# Patient Record
Sex: Female | Born: 1960 | Race: Asian | Hispanic: No | Marital: Married
Health system: Midwestern US, Community
[De-identification: ages and names within clinical notes are randomized; demographics above are authoritative.]

## PROBLEM LIST (undated history)

## (undated) DIAGNOSIS — G43909 Migraine, unspecified, not intractable, without status migrainosus: Secondary | ICD-10-CM

## (undated) DIAGNOSIS — R52 Pain, unspecified: Secondary | ICD-10-CM

## (undated) DIAGNOSIS — M79672 Pain in left foot: Secondary | ICD-10-CM

## (undated) DIAGNOSIS — R109 Unspecified abdominal pain: Secondary | ICD-10-CM

---

## 2008-02-15 ENCOUNTER — Emergency Department (HOSPITAL_COMMUNITY): Admission: EM | Admit: 2008-02-15 | Discharge: 2008-02-15 | Payer: Self-pay | Admitting: Emergency Medicine

## 2008-04-04 ENCOUNTER — Emergency Department (HOSPITAL_COMMUNITY): Admission: EM | Admit: 2008-04-04 | Discharge: 2008-04-04 | Payer: Self-pay | Admitting: Family Medicine

## 2008-06-05 ENCOUNTER — Emergency Department (HOSPITAL_COMMUNITY): Admission: EM | Admit: 2008-06-05 | Discharge: 2008-06-05 | Payer: Self-pay | Admitting: Family Medicine

## 2008-07-25 ENCOUNTER — Emergency Department (HOSPITAL_COMMUNITY): Admission: EM | Admit: 2008-07-25 | Discharge: 2008-07-25 | Payer: Self-pay | Admitting: Family Medicine

## 2008-08-18 ENCOUNTER — Encounter: Admission: RE | Admit: 2008-08-18 | Discharge: 2008-08-18 | Payer: Self-pay | Admitting: Pulmonary Disease

## 2008-08-22 ENCOUNTER — Emergency Department (HOSPITAL_COMMUNITY): Admission: EM | Admit: 2008-08-22 | Discharge: 2008-08-22 | Payer: Self-pay | Admitting: Family Medicine

## 2008-09-01 ENCOUNTER — Emergency Department (HOSPITAL_COMMUNITY): Admission: EM | Admit: 2008-09-01 | Discharge: 2008-09-01 | Payer: Self-pay | Admitting: Emergency Medicine

## 2008-09-17 ENCOUNTER — Emergency Department (HOSPITAL_COMMUNITY): Admission: EM | Admit: 2008-09-17 | Discharge: 2008-09-17 | Payer: Self-pay | Admitting: Family Medicine

## 2008-09-22 ENCOUNTER — Emergency Department (HOSPITAL_COMMUNITY): Admission: EM | Admit: 2008-09-22 | Discharge: 2008-09-22 | Payer: Self-pay | Admitting: Family Medicine

## 2008-10-27 ENCOUNTER — Encounter: Admission: RE | Admit: 2008-10-27 | Discharge: 2008-10-27 | Payer: Self-pay | Admitting: Surgery

## 2008-11-21 ENCOUNTER — Encounter: Payer: Self-pay | Admitting: Physician Assistant

## 2008-11-30 DIAGNOSIS — R229 Localized swelling, mass and lump, unspecified: Secondary | ICD-10-CM

## 2008-12-11 ENCOUNTER — Ambulatory Visit: Payer: Self-pay | Admitting: Physician Assistant

## 2008-12-11 DIAGNOSIS — G43909 Migraine, unspecified, not intractable, without status migrainosus: Secondary | ICD-10-CM | POA: Insufficient documentation

## 2008-12-11 DIAGNOSIS — N949 Unspecified condition associated with female genital organs and menstrual cycle: Secondary | ICD-10-CM

## 2008-12-11 LAB — CONVERTED CEMR LAB: Beta hcg, urine, semiquantitative: NEGATIVE

## 2008-12-12 LAB — CONVERTED CEMR LAB
ALT: 9 units/L (ref 0–35)
AST: 15 units/L (ref 0–37)
Albumin: 4.8 g/dL (ref 3.5–5.2)
Alkaline Phosphatase: 60 units/L (ref 39–117)
BUN: 6 mg/dL (ref 6–23)
Basophils Absolute: 0 10*3/uL (ref 0.0–0.1)
Basophils Relative: 0 % (ref 0–1)
CO2: 20 meq/L (ref 19–32)
Calcium: 9.4 mg/dL (ref 8.4–10.5)
Chloride: 105 meq/L (ref 96–112)
Creatinine, Ser: 0.57 mg/dL (ref 0.40–1.20)
Eosinophils Absolute: 0.1 10*3/uL (ref 0.0–0.7)
Eosinophils Relative: 1 % (ref 0–5)
FSH: 6.6 milliintl units/mL
Glucose, Bld: 96 mg/dL (ref 70–99)
HCT: 40.7 % (ref 36.0–46.0)
Hemoglobin: 13.5 g/dL (ref 12.0–15.0)
Lymphocytes Relative: 28 % (ref 12–46)
Lymphs Abs: 2.6 10*3/uL (ref 0.7–4.0)
MCHC: 33.2 g/dL (ref 30.0–36.0)
MCV: 90.8 fL (ref 78.0–100.0)
Monocytes Absolute: 0.7 10*3/uL (ref 0.1–1.0)
Monocytes Relative: 7 % (ref 3–12)
Neutro Abs: 6.2 10*3/uL (ref 1.7–7.7)
Neutrophils Relative %: 64 % (ref 43–77)
Platelets: 303 10*3/uL (ref 150–400)
Potassium: 4.5 meq/L (ref 3.5–5.3)
RBC: 4.48 M/uL (ref 3.87–5.11)
RDW: 13.9 % (ref 11.5–15.5)
Sodium: 141 meq/L (ref 135–145)
TSH: 2.419 microintl units/mL (ref 0.350–4.500)
Total Bilirubin: 2.7 mg/dL — ABNORMAL HIGH (ref 0.3–1.2)
Total Protein: 7.4 g/dL (ref 6.0–8.3)
WBC: 9.6 10*3/uL (ref 4.0–10.5)

## 2008-12-18 ENCOUNTER — Telehealth: Payer: Self-pay | Admitting: Physician Assistant

## 2009-01-11 ENCOUNTER — Other Ambulatory Visit: Admission: RE | Admit: 2009-01-11 | Discharge: 2009-01-11 | Payer: Self-pay | Admitting: Family Medicine

## 2009-01-11 ENCOUNTER — Ambulatory Visit: Payer: Self-pay | Admitting: Physician Assistant

## 2009-01-12 LAB — CONVERTED CEMR LAB
Chlamydia, DNA Probe: NEGATIVE
GC Probe Amp, Genital: NEGATIVE

## 2009-01-15 ENCOUNTER — Encounter: Payer: Self-pay | Admitting: Physician Assistant

## 2009-01-15 LAB — CONVERTED CEMR LAB

## 2009-01-26 ENCOUNTER — Encounter: Payer: Self-pay | Admitting: Physician Assistant

## 2009-01-26 ENCOUNTER — Ambulatory Visit (HOSPITAL_COMMUNITY): Admission: RE | Admit: 2009-01-26 | Discharge: 2009-01-26 | Payer: Self-pay | Admitting: Internal Medicine

## 2009-01-29 ENCOUNTER — Ambulatory Visit (HOSPITAL_COMMUNITY): Admission: RE | Admit: 2009-01-29 | Discharge: 2009-01-29 | Payer: Self-pay | Admitting: Internal Medicine

## 2009-01-30 ENCOUNTER — Encounter (INDEPENDENT_AMBULATORY_CARE_PROVIDER_SITE_OTHER): Payer: Self-pay | Admitting: *Deleted

## 2009-03-11 ENCOUNTER — Telehealth: Payer: Self-pay | Admitting: Physician Assistant

## 2009-03-13 ENCOUNTER — Ambulatory Visit: Payer: Self-pay | Admitting: Physician Assistant

## 2009-06-12 ENCOUNTER — Emergency Department (HOSPITAL_COMMUNITY): Admission: EM | Admit: 2009-06-12 | Discharge: 2009-06-12 | Payer: Self-pay | Admitting: Family Medicine

## 2009-09-15 IMAGING — CR DG CHEST 2V
2 series · 2 of 2 positions shown · non-contrast
Comparison: None

CLINICAL DATA: Positive PPD test.

CHEST - 2 VIEW

[w chest pa]
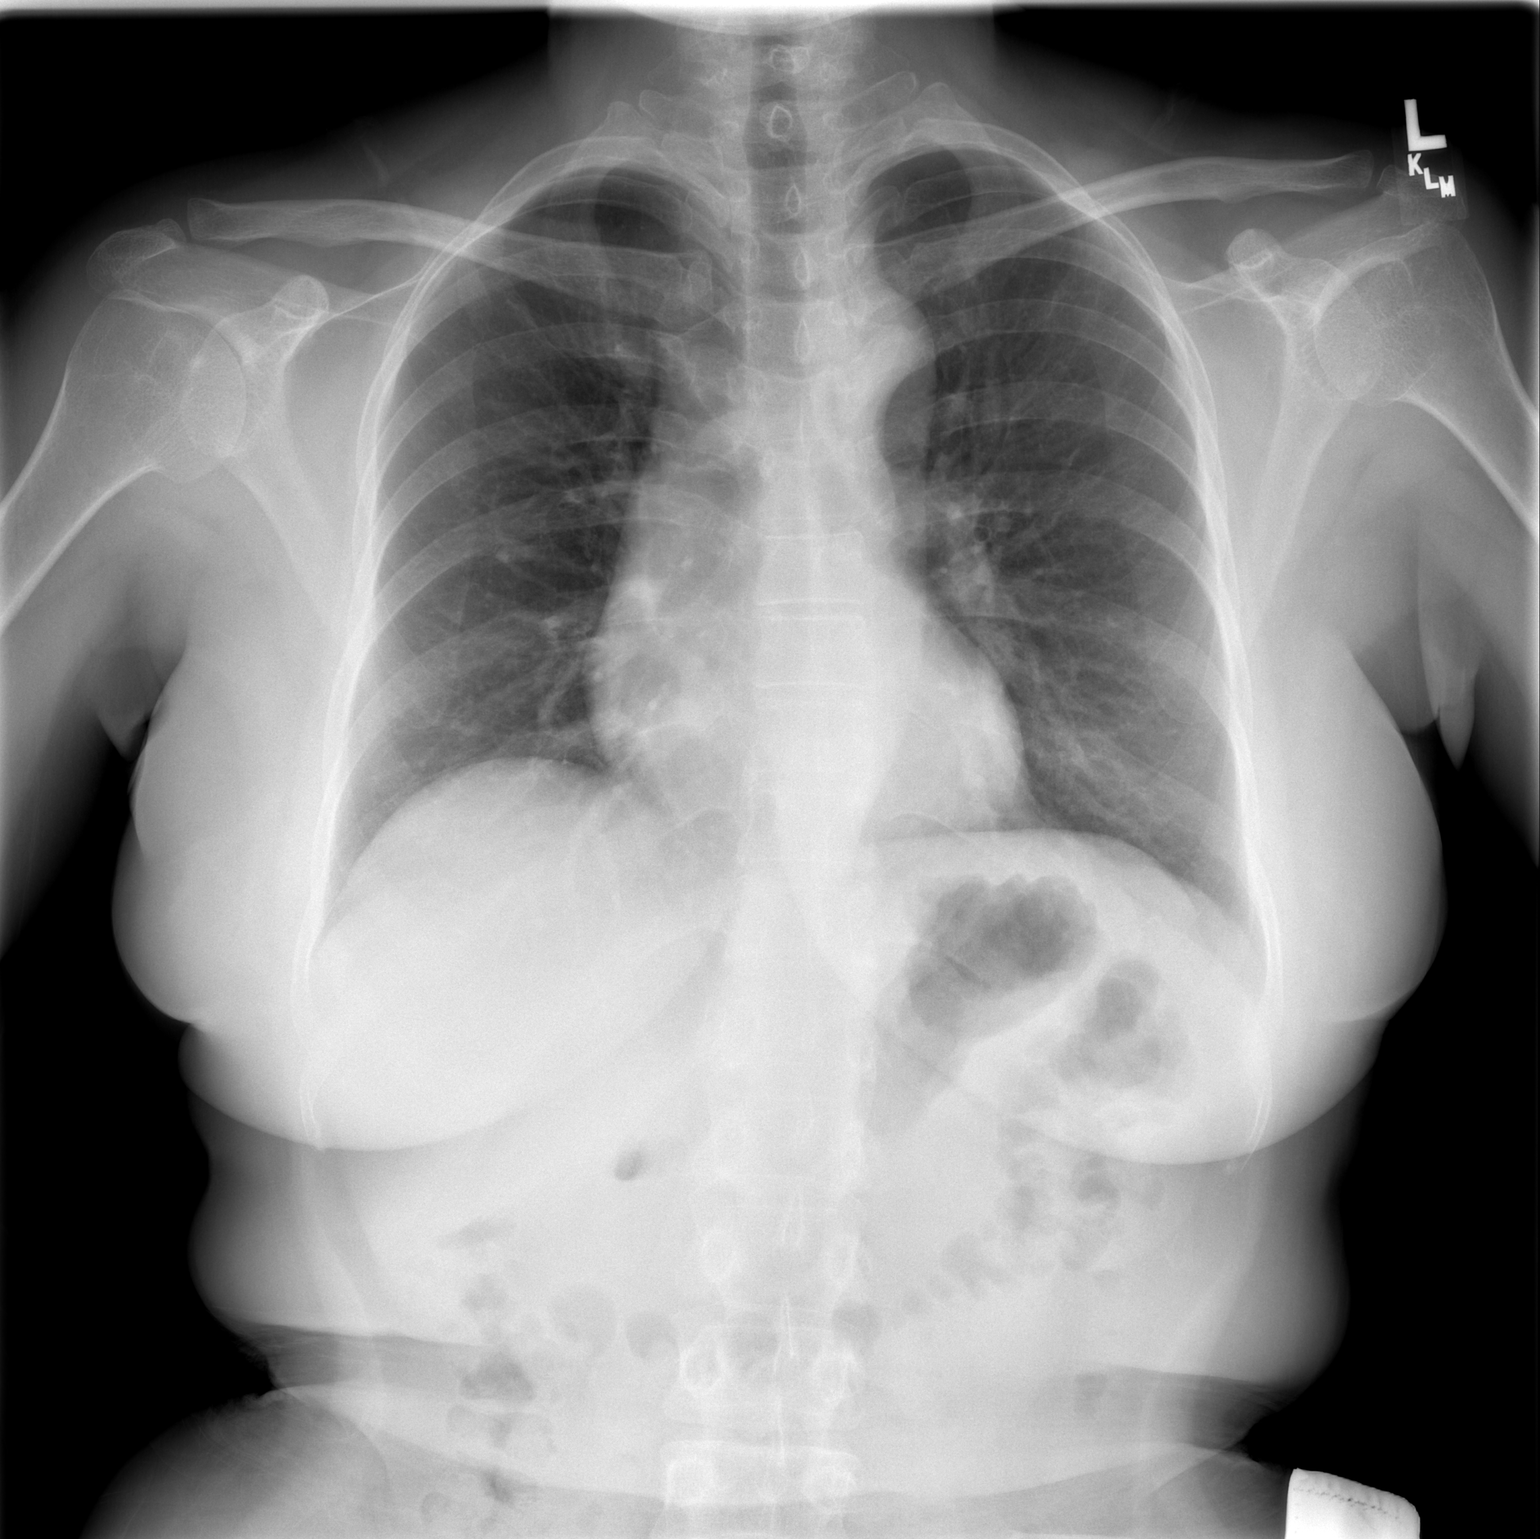

[w chest lat]
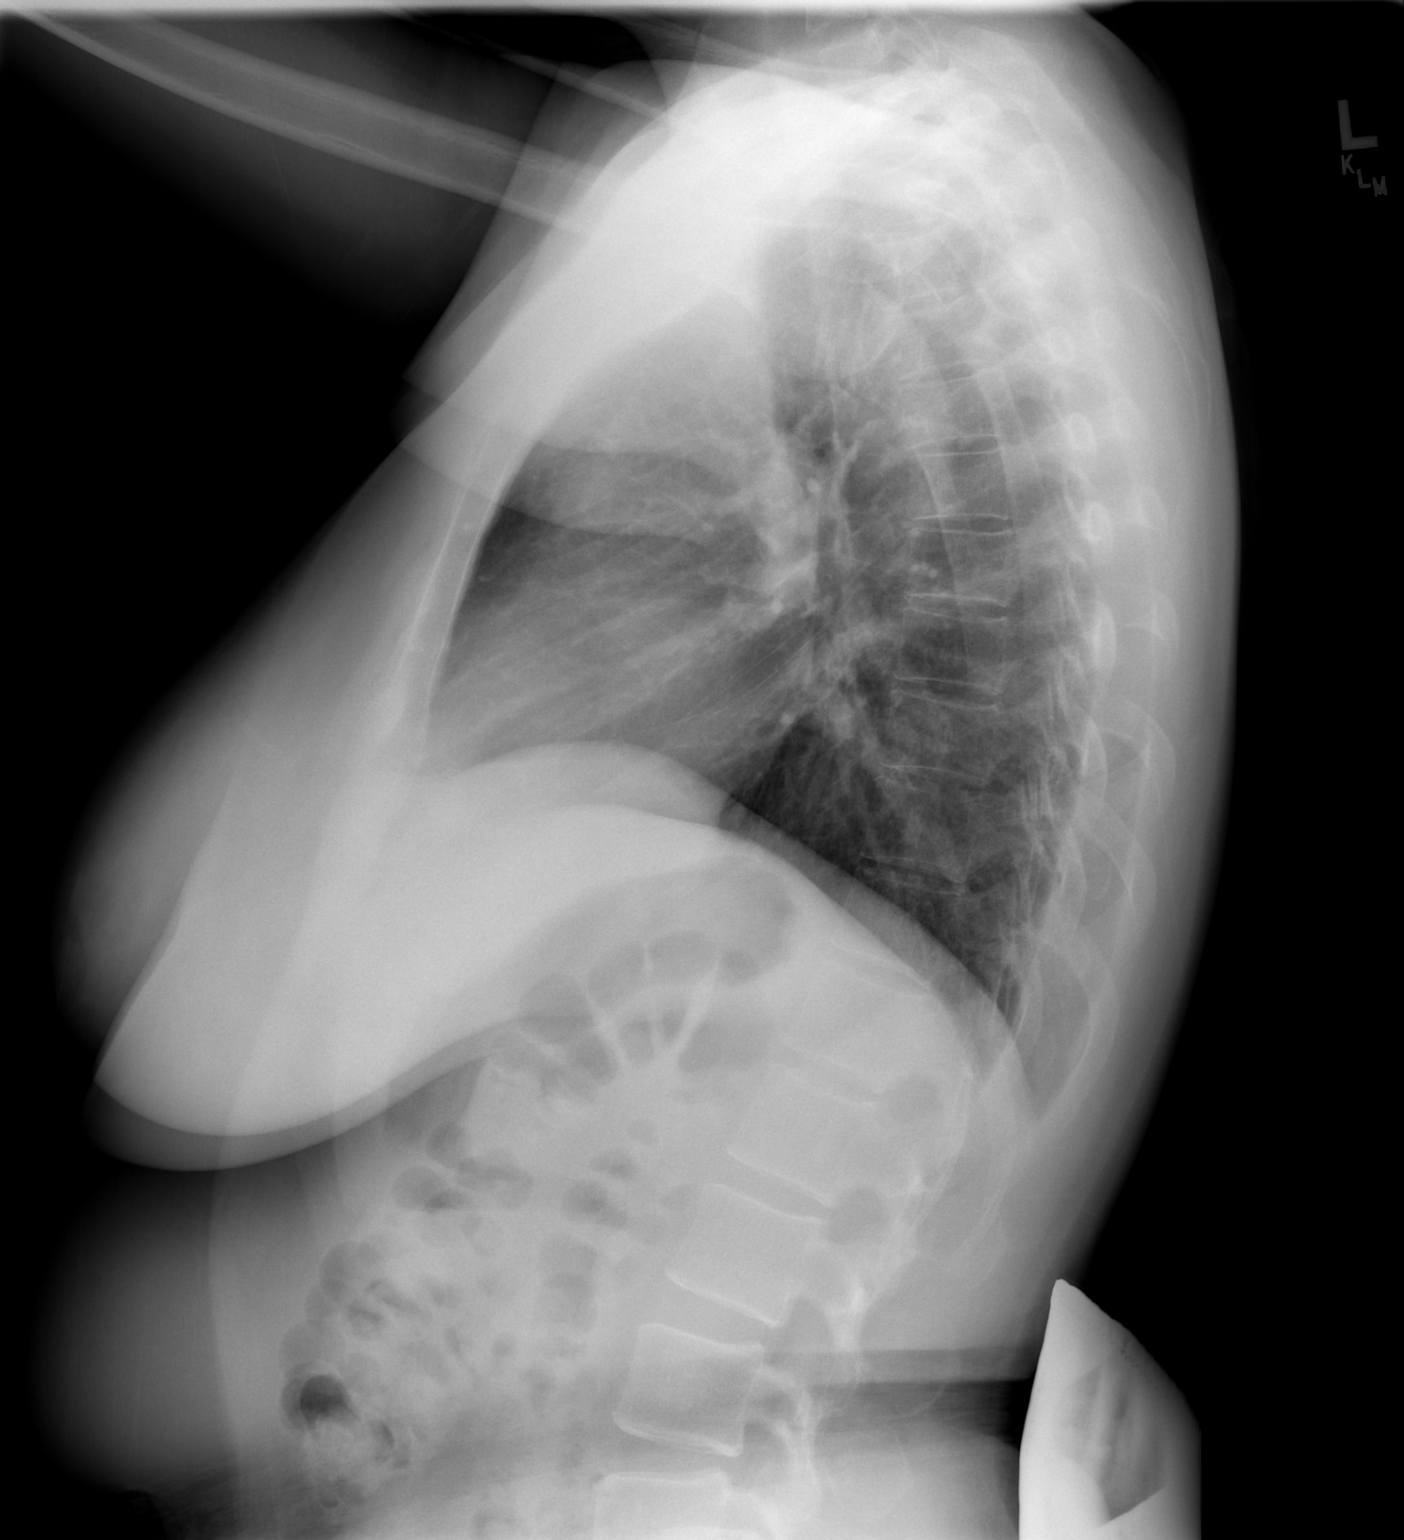

[2 of 2 positions shown; findings below may reference images not displayed]

FINDINGS: Cardiac and mediastinal contours appear normal.

The lungs appear clear.

No pleural effusion is identified.
IMPRESSION: No evidence of active pulmonary tuberculosis.

## 2009-10-24 ENCOUNTER — Ambulatory Visit: Payer: Self-pay | Admitting: Physician Assistant

## 2009-10-25 ENCOUNTER — Telehealth: Payer: Self-pay | Admitting: Physician Assistant

## 2009-10-25 ENCOUNTER — Encounter: Payer: Self-pay | Admitting: Physician Assistant

## 2009-10-26 ENCOUNTER — Telehealth: Payer: Self-pay | Admitting: Physician Assistant

## 2009-10-30 ENCOUNTER — Ambulatory Visit (HOSPITAL_COMMUNITY): Admission: RE | Admit: 2009-10-30 | Discharge: 2009-10-30 | Payer: Self-pay | Admitting: Internal Medicine

## 2009-11-01 ENCOUNTER — Encounter (INDEPENDENT_AMBULATORY_CARE_PROVIDER_SITE_OTHER): Payer: Self-pay | Admitting: *Deleted

## 2009-11-12 ENCOUNTER — Encounter: Payer: Self-pay | Admitting: Physician Assistant

## 2009-12-05 ENCOUNTER — Ambulatory Visit: Payer: Self-pay | Admitting: Physician Assistant

## 2009-12-11 ENCOUNTER — Ambulatory Visit (HOSPITAL_COMMUNITY): Admission: RE | Admit: 2009-12-11 | Discharge: 2009-12-11 | Payer: Self-pay | Admitting: Internal Medicine

## 2009-12-11 DIAGNOSIS — K219 Gastro-esophageal reflux disease without esophagitis: Secondary | ICD-10-CM | POA: Insufficient documentation

## 2010-02-08 ENCOUNTER — Ambulatory Visit: Payer: Self-pay | Admitting: Nurse Practitioner

## 2010-02-08 DIAGNOSIS — M25529 Pain in unspecified elbow: Secondary | ICD-10-CM

## 2010-02-28 ENCOUNTER — Ambulatory Visit (HOSPITAL_COMMUNITY)
Admission: RE | Admit: 2010-02-28 | Discharge: 2010-02-28 | Payer: Self-pay | Source: Home / Self Care | Attending: Internal Medicine | Admitting: Internal Medicine

## 2010-03-26 NOTE — Progress Notes (Signed)
Summary: f/u on labs  Phone Note Outgoing Call   Summary of Call: no anemia no sign of ulcers continue same medicine and f/u as planned Initial call taken by: Brynda Rim,  October 26, 2009 2:18 PM  Follow-up for Phone Call        left message for pt. to give [place] a call... Linzie Collin CMA Student  October 26, 2009 5:18 PM   Left message on answering machine for pt to call back.Marland KitchenMarland KitchenArmenia Shannon  October 31, 2009 8:42 AM    Left message on answering machine for pt to call back....will mail letter..[place] Shannon  November 01, 2009 9:49 AM

## 2010-03-26 NOTE — Assessment & Plan Note (Signed)
Summary: PAIN UNDER ARM////KT   Vital Signs:  Patient profile:   50 year old female Height:      60 inches Weight:      144 pounds BMI:     28.22 Temp:     98.2 degrees F oral Pulse rate:   74 / minute Pulse rhythm:   regular Resp:     18 per minute BP sitting:   128 / 82  (left arm) Cuff size:   regular  Vitals Entered By: [place] Shannon (October 24, 2009 2:04 PM) CC: office visit for pain under left arm and abdominal pain after eating, onset 4-5 five months off and on Pain Assessment Patient in pain? yes     Location: left under arm Intensity: 7 Type: stinging Onset of pain  Intermittent  Does patient need assistance? Functional Status Self care Ambulation Normal   Primary Care Provider:  Tereso Newcomer PA-C  CC:  office visit for pain under left arm and abdominal pain after eating and onset 4-5 five months off and on.  History of Present Illness: Here for epigastric pain.  Made worse with eating.  Not taking anything for it.  NO vomiting, diarrhea.  No melena or hematochezia.  No weight loss.  SOme sore throat.  SOme indigestion.  Notes assoc headache.  DOes not eat spicy food.  Does drink some soda.  Also notes pain in left axilla. Pain like before mass excised.  No drainage.  No fever.  Has not seen surgeon.  Current Medications (verified): 1)  Rifampin 300 Mg Caps (Rifampin) .... Take Two Caps Daily 2)  Imitrex 50 Mg Tabs (Sumatriptan Succinate) .... Take 1 By Mouth As Needed For Headache, May Repeat X 1 After 2 Hours If Headache Recurs. 3)  Amitriptyline Hcl 10 Mg Tabs (Amitriptyline Hcl) .... Take 1 Tab By Mouth At Bedtime  Allergies (verified): No Known Drug Allergies  Past History:  Past Medical History: Reviewed history from 12/11/2008 and no changes required. Current Problems:  POSITIVE PPD (ICD-795.5)      a.  on rif[facility]in  [facility] HEADACHE (ICD-784.0)      a.  migraine headaches MASS, LEFT AXILLA (ICD-782.2)  Past Surgical  History: Reviewed history from 11/30/2008 and no changes required. s/p excision of left axillary mass (benign)  Physical Exam  General:  alert, well-developed, and well-nourished.   Head:  normocephalic and atraumatic.   Mouth:  pharynx pink and moist.   Neck:  supple.   Lungs:  normal breath sounds.   Heart:  normal rate and regular rhythm.   Abdomen:  soft, normal bowel sounds, no hepatomegaly, and epigastric tenderness.   Neurologic:  alert & oriented X3 and cranial nerves II-XII intact.   Skin:  left axilla with soft enlargement over incision site no fluctuance or induration - ? skin stretched very painful to touch no discharge no adenopathy  Psych:  normally interactive.     Impression & Recommendations:  Problem # 1:  GERD (ICD-530.81)  check cbc check H. pylori IgM start zantac 150 mg two times a day f/u 6 weeks  Orders: T-CBC w/Diff (78295-62130) T-H Pylori Antibody IgM (86578-46962)  Her updated medication list for this problem includes:    Zantac 150 Mg Tabs (Ranitidine hcl) .Marland Kitchen... 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 Take two caps daily 2)  Imitrex 50 Mg Tabs (Sumatriptan succinate) .... Take 1 by mouth as needed for headache, may repeat x 1 after 2 hours if headache recurs. 3)  Amitriptyline Hcl 10 Mg Tabs (Amitriptyline hcl) .... Take 1 tab by mouth at bedtime 4)  Zantac 150 Mg Tabs (Ranitidine hcl) .... Take 1 tablet by mouth two times a day 5)  Tramadol Hcl 50 Mg Tabs (Tramadol hcl) .... Take 1 tablet by mouth once a day as needed for severe pain  Patient Instructions: 1)  Take Tylenol 500 mg (Acetaminophen) 1-2 tabs every 6 hours. 2)  Avoid Ibuprofen, Aleve,  Motrin, Goody's, etc. because of your stomach. 3)  Tramadol:  You can take this medicine once a day as needed for severe pain.  It will make you drowsy.  Do not take it and drive. 4)  Take Zantac every day two times a day. 5)  Schedule a follow up with Scott in 6 weeks for your stomach. 6)  Avoid foods high in acid(tomatoes, citrus juices,spicy foods).Avoid eating within two hours of lying down or before exercising. Do not over eat: try smaller more frequent meals. Elevate head of bed twelve inches when sleeping. Avoid caffeine. Prescriptions: TRAMADOL HCL 50 MG TABS (TRAMADOL HCL) Take 1 tablet by mouth once a day as needed for severe pain  #30 x 0   Entered and Authorized by:   Tereso Newcomer PA-C   Signed by:   Tereso Newcomer PA-C on 10/24/2009   Method used:   Print then Give to Patient   RxID:   [id number] ZANTAC 150 MG TABS (RANITIDINE HCL) Take 1 tablet by mouth two times a day  #60 x 3   Entered and Authorized by:   Tereso Newcomer PA-C   Signed by:   Tereso Newcomer PA-C on 10/24/2009   Method used:   Print then Give to Patient   RxID:   [id number]

## 2010-03-26 NOTE — Assessment & Plan Note (Signed)
Summary: 2 MONTH FOLLOW-UP HEADACHES///BC   Vital Signs:  Patient profile:   50 year old female Height:      60 inches Weight:      138 pounds BMI:     27.05 Temp:     97.5 degrees F oral Pulse rate:   63 / minute Pulse rhythm:   regular Resp:     18 per minute BP sitting:   138 / 83  (left arm) Cuff size:   regular  Vitals Entered By: [place] Shannon (March 13, 2009 9:00 AM) CC: f/u on headaches.... Is Patient Diabetic? No Pain Assessment Patient in pain? no       Does patient need assistance? Functional Status Self care Ambulation Normal   CC:  f/u on headaches.....  History of Present Illness: Here for f/u on headaches. No translator.  Here with her son who speaks some [place]. No headaches since Dec. 25. She apparently took imitrex every day for 4 days in a row. She took amitriptyline for one month.  Was not sure how to refill it. She feels like her headaches improved with the amitriptyline.  She only had the one time around 12.25 where her headaches recurred and she had to take the medicine. Still notes headaches for 30+ years without change.  No visual changes, focal deficits.  Current Medications (verified): 1)  Rifampin 300 Mg Caps (Rifampin) .... Take Two Caps Daily 2)  Imitrex 50 Mg Tabs (Sumatriptan Succinate) .... Take 1 By Mouth As Needed For Headache, May Repeat X 1 After 2 Hours If Headache Recurs. 3)  Amitriptyline Hcl 10 Mg Tabs (Amitriptyline Hcl) .... Take 1 Tab By Mouth At Bedtime  Allergies (verified): No Known Drug Allergies  Physical Exam  General:  alert, well-developed, and well-nourished.   Head:  normocephalic and atraumatic.   Lungs:  normal breath sounds.   Heart:  normal rate and regular rhythm.   Neurologic:  alert & oriented X3, cranial nerves II-XII intact, and strength normal in all extremities.   Psych:  normally interactive.     Impression & Recommendations:  Problem # 1:  MIGRAINE HEADACHE (ICD-346.90) patient wants to  continue the amitriptyline explained to her son how to refill meds . . . they were not sure also explained to them that she should avoid taking imitrex for several days in a row . . . she should call us if she has more headaches f/u in 3 mos for headaches  Her updated medication list for this problem includes:    Imitrex 50 Mg Tabs (Sumatriptan succinate) .Marland Kitchen... Take 1 by mouth as needed for headache, may repeat x 1 after 2 hours if headache recurs.  Complete Medication List: 1)  Rifampin 300 Mg Caps (Rifampin) .... Take two caps daily 2)  Imitrex 50 Mg Tabs (Sumatriptan succinate) .... Take 1 by mouth as needed for headache, may repeat x 1 after 2 hours if headache recurs. 3)  Amitriptyline Hcl 10 Mg Tabs (Amitriptyline hcl) .... Take 1 tab by mouth at bedtime  Patient Instructions: 1)  Please schedule a follow-up appointment in 3 months with Scott for headaches. 2)  Call us if you run out of refills to get the medicine refilled. 3)

## 2010-03-26 NOTE — Letter (Signed)
Summary: TEST ORDER FORM//MAMMOGRAM//APPT DATE & TIME  TEST ORDER FORM//MAMMOGRAM//APPT DATE & TIME   Imported By: Arta Bruce 03/15/2009 15:56:15  _____________________________________________________________________  External Attachment:    Type:   Image     Comment:   External Document

## 2010-03-26 NOTE — Letter (Signed)
Summary: TEST ORDER FORM//MAMMOGRAM//APPT DATE & TIME  TEST ORDER FORM//MAMMOGRAM//APPT DATE & TIME   Imported By: Arta Bruce 03/15/2009 15:08:50  _____________________________________________________________________  External Attachment:    Type:   Image     Comment:   External Document

## 2010-03-26 NOTE — Letter (Signed)
Summary: MED/SOLUTIONS//APPROVED  MED/SOLUTIONS//APPROVED   Imported By: Arta Bruce 03/20/2009 10:24:50  _____________________________________________________________________  External Attachment:    Type:   Image     Comment:   External Document

## 2010-03-26 NOTE — Miscellaneous (Signed)
Summary: pap smear normal  Clinical Lists Changes  Observations: Added new observation of PAP SMEAR:  Specimen Adequacy: Satisfactory for evaluation.   Interpretation/Result:Negative for intraepithelial Lesion or Malignancy.    (01/15/2009 9:46)      Pap Smear  Procedure date:  01/15/2009  Findings:       Specimen Adequacy: Satisfactory for evaluation.   Interpretation/Result:Negative for intraepithelial Lesion or Malignancy.     Comments:      Repeat Pap in 2 years.

## 2010-03-26 NOTE — Progress Notes (Signed)
  Phone Note Outgoing Call   Summary of Call: H. pylori needs to be IgM.  IgG is pending.  Cancel IgG and get IgM. Initial call taken by: Brynda Rim,  October 25, 2009 2:17 PM  Follow-up for Phone Call        done.... [place] Shannon  October 25, 2009 4:54 PM

## 2010-03-26 NOTE — Letter (Signed)
Summary: GYNRCOLOGIC CYTOLOGY REPORT  GYNRCOLOGIC CYTOLOGY REPORT   Imported By: Arta Bruce 03/20/2009 09:59:42  _____________________________________________________________________  External Attachment:    Type:   Image     Comment:   External Document

## 2010-03-26 NOTE — Letter (Signed)
Summary: *HSN Results Follow up  [facility]  [address] [place], [place] [address]   Phone: [phone]  Fax: [phone]      11/01/2009   [facility] [place] [address] [place], [place]  [address]   Dear  Ms. NARMAYA [place],                            ____S.Drinkard,FNP   ____D. Gore,FNP       ____B. McPherson,MD   ____V. Rankins,MD    ____E. Mulberry,MD    ____N. Daphine Deutscher, FNP  ____D. Reche Dixon, MD    ____K. Philipp Deputy, MD    ____Other     This letter is to inform you that your recent test(s):  _______Pap Smear    _______Lab Test     _______X-ray    _______ is within acceptable limits  _______ requires a medication change  _______ requires a follow-up lab visit  _______ requires a follow-up visit with your Onie Kasparek   Comments:  We have been trying to reach you.  Please give the office a call back at your earliest convenience.       _________________________________________________________ If you have any questions, please contact our office                     Sincerely,  Arm[place] Shannon [facility]

## 2010-03-26 NOTE — Assessment & Plan Note (Signed)
Summary: gerd   Vital Signs:  Patient profile:   50 year old female Height:      60 inches Weight:      140 pounds BMI:     27.44 Temp:     97.1 degrees F oral Pulse rate:   72 / minute Pulse rhythm:   regular Resp:     16 per minute BP sitting:   110 / 84  (left arm) Cuff size:   regular  Vitals Entered By: [place] Shannon (December 05, 2009 10:34 AM) CC: 6 week f/u..., Abdominal Pain Is Patient Diabetic? No Pain Assessment Patient in pain? no       Does patient need assistance? Functional Status Self care Ambulation Normal   Primary Care Provider:  Tereso Newcomer PA-C  CC:  6 week f/u... and Abdominal Pain.  History of Present Illness: Axillary pain:  u/s was ok.  Pain has resolved.   Dyspepsia History:      She has no alarm features of dyspepsia including no history of melena, hematochezia, dysphagia, persistent vomiting, or involuntary weight loss > 5%.  There is a prior history of GERD.  An H-2 blocker medication is currently being taken.  She notes that the symptoms have not improved with the H-2 blocker therapy.  She has no history of a positive H. Pylori serology.  No previous upper endoscopy has been done.   Her symptoms actually worsened with zantac.  Also notes arm pain with taking zantac.  Symptoms improved after stopping zantac.  Still has epigastric pain with eating any type of food.  No alcohol, cigs or caffeine.  Current Medications (verified): 1)  Rifampin 300 Mg Caps (Rifampin) .... Take Two Caps Daily 2)  Imitrex 50 Mg Tabs (Sumatriptan Succinate) .... Take 1 By Mouth As Needed For Headache, May Repeat X 1 After 2 Hours If Headache Recurs. 3)  Amitriptyline Hcl 10 Mg Tabs (Amitriptyline Hcl) .... Take 1 Tab By Mouth At Bedtime 4)  Zantac 150 Mg Tabs (Ranitidine Hcl) .... Take 1 Tablet By Mouth Two Times A Day 5)  Tramadol Hcl 50 Mg Tabs (Tramadol Hcl) .... Take 1 Tablet By Mouth Once A Day As Needed For Severe Pain  Allergies (verified): No Known Drug  Allergies  Physical Exam  General:  alert, well-developed, and well-nourished.   Head:  normocephalic and atraumatic.   Neck:  supple.   Lungs:  normal breath sounds.   Heart:  normal rate and regular rhythm.   Abdomen:  soft, normal bowel sounds, no hepatomegaly, and epigastric tenderness.   Neurologic:  alert & oriented X3 and cranial nerves II-XII intact.   Psych:  normally interactive.     Impression & Recommendations:  Problem # 1:  GERD (ICD-530.81) Assessment Unchanged  actually felt worse with the zantac change to PPI get UGI series f/u in 6-8 weeks if no improvement, may need to send to GI . Marland Kitchen Marland Kitchen send to GI sooner if UGI is significantly abnl  Her updated medication list for this problem includes:    Protonix 40 Mg Tbec (Pantoprazole sodium) .Marland Kitchen... Take 1 tablet by mouth once a day for stomach  Orders: UGI Series (UGI Series)  Complete Medication List: 1)  Rifampin 300 Mg Caps (Rifampin) .... Take two caps daily 2)  Imitrex 50 Mg Tabs (Sumatriptan succinate) .... Take 1 by mouth as needed for headache, may repeat x 1 after 2 hours if headache recurs. 3)  Amitriptyline Hcl 10 Mg Tabs (Amitriptyline hcl) .... Take 1  tab by mouth at bedtime 4)  Protonix 40 Mg Tbec (Pantoprazole sodium) .... Take 1 tablet by mouth once a day for stomach 5)  Tramadol Hcl 50 Mg Tabs (Tramadol hcl) .... Take 1 tablet by mouth once a day as needed for severe pain  Other Orders: Flu Vaccine 80yrs + (08657) Admin 1st Vaccine (84696) Admin 1st Vaccine [facility]) [phone])  Patient Instructions: 1)  Stop taking Zantac (ranitidine) 2)  Start taking Protonix once daily for stomach.  The prescription was sent to the pharmacy at [facility]. 3)  Get the xray done. 4)  Schedule follow up in 6-8 weeks for stomach.  Return sooner if symptoms are worse. Prescriptions: PROTONIX 40 MG TBEC (PANTOPRAZOLE SODIUM) Take 1 tablet by mouth once a day for stomach  #30 x 3   Entered and Authorized by:   Tereso Newcomer PA-C   Signed by:   Tereso Newcomer PA-C on 12/05/2009   Method used:   Faxed to ...       [facility] (retail)       [address] [place], [place]  [address]       Ph: [phone]       Fax: [phone]   RxID:   [id number]    Influenza Vaccine    Vaccine Type: Fluvax 3+    Site: left deltoid    Mfr: GlaxoSmithKline    Dose: 0.5 ml    Route: IM    Given by: [place] Shannon    Exp. Date: 07/2010    Lot #: [id number]    VIS given: 09/18/09 version given December 05, 2009.  Flu Vaccine Consent Questions    Do you have a history of severe allergic reactions to this vaccine? no    Any prior history of allergic reactions to egg and/or gelatin? no    Do you have a sensitivity to the preservative Thimersol? no    Do you have a past history of Guillan-Barre Syndrome? no    Do you currently have an acute febrile illness? no    Have you ever had a severe reaction to latex? no    Vaccine information given and explained to patient? yes    Are you currently pregnant? no

## 2010-03-26 NOTE — Letter (Signed)
Summary: PAP SMEAR RESULTS  PAP SMEAR RESULTS   Imported By: Arta Bruce 04/24/2009 14:36:54  _____________________________________________________________________  External Attachment:    Type:   Image     Comment:   External Document

## 2010-03-26 NOTE — Progress Notes (Signed)
  Phone Note Outgoing Call   Summary of Call: I still have not seen pap results come back on this patient.  Please request results. Initial call taken by: Tereso Newcomer PA-C,  March 11, 2009 2:15 PM  Follow-up for Phone Call        copy is being faxed......Marland KitchenArmenia Shannon  March 13, 2009 9:14 AM

## 2010-03-28 NOTE — Letter (Signed)
Summary: Handout Printed  Printed Handout:  - Elbow - Tennis

## 2010-03-28 NOTE — Assessment & Plan Note (Signed)
Summary: Right Elbow Pain   Vital Signs:  Patient profile:   50 year old female Weight:      143.0 pounds BMI:     28.03 Temp:     97.5 degrees F oral Pulse rate:   76 / minute Pulse rhythm:   regular Resp:     20 per minute BP sitting:   116 / 80  (left arm) Cuff size:   regular  Vitals Entered By: Levon Hedger (February 08, 2010 10:34 AM)  Nutrition Counseling: Patient's BMI is greater than 25 and therefore counseled on weight management options. CC: right hand and arm painful x 4 months Is Patient Diabetic? No Pain Assessment Patient in pain? yes     Location: arm  Does patient need assistance? Functional Status Self care Ambulation Normal Comments pt states she is currently not taking medication   Primary Care Provider:  Tereso Newcomer PA-C  CC:  right hand and arm painful x 4 months.  History of Present Illness:  Pt into the office for right elbow pain started 4 months ago  pain has gradually increased over the past 4 months intermittent right hand dominant Social - pt is not employed outside the home Pt has not take any over the counter medications for the problems   Daughter present today with pt during exam who is interpretering for her  Current Medications (verified): 1)  Protonix 40 Mg Tbec (Pantoprazole Sodium) .... Take 1 Tablet By Mouth Once A Day For Stomach 2)  Tramadol Hcl 50 Mg Tabs (Tramadol Hcl) .... Take 1 Tablet By Mouth Once A Day As Needed For Severe Pain 3)  Ibuprofen 800 Mg Tabs (Ibuprofen) .... One Tablet By Mouth Two Times A Day As Needed For Pain  Allergies (verified): No Known Drug Allergies  Review of Systems CV:  Denies chest pain or discomfort. Resp:  Denies cough. GI:  Denies abdominal pain, nausea, and vomiting. MS:  Complains of joint pain.  Physical Exam  General:  alert.   Head:  normocephalic.     Shoulder/Elbow Exam  Elbow Exam:    Right:    Inspection:  Normal    Palpation:  Normal    Stability:   stable    Tenderness:  right medial epicondyle    Swelling:  no    Erythema:  no    full ROM   Impression & Recommendations:  Problem # 1:  ELBOW PAIN, RIGHT (ICD-719.42)  handout  will apply ACE wrap ibuprofen started  Orders: Ace Wraps 3-5 in/yard  (E4540)  Complete Medication List: 1)  Protonix 40 Mg Tbec (Pantoprazole sodium) .... Take 1 tablet by mouth once a day for stomach 2)  Tramadol Hcl 50 Mg Tabs (Tramadol hcl) .... Take 1 tablet by mouth once a day as needed for severe pain 3)  Ibuprofen 800 Mg Tabs (Ibuprofen) .... One tablet by mouth two times a day as needed for pain  Patient Instructions: 1)  Read handout 2)  Wear ace wrap for the next week 3)  Take ibuprofen 867m by mouth two times a day with food for 3 days then as needed  4)  Schedule an appointment for a complete physcial exam 5)  No food or drink before this appointment.  May drink water only 6)  You will need PAP, Mammogram, labs. Prescriptions: IBUPROFEN 800 MG TABS (IBUPROFEN) One tablet by mouth two times a day as needed for pain  #50 x 0   Entered and Authorized by:  Lehman Prom FNP   Signed by:   Lehman Prom FNP on 02/08/2010   Method used:   Print then Give to Patient   RxID:   [id number]    Orders Added: 1)  Est. Patient Level III [56213] 2)  Ace Wraps 3-5 in/yard  [Y8657]

## 2010-06-02 LAB — GLUCOSE, CAPILLARY: Glucose-Capillary: 79 mg/dL (ref 70–99)

## 2010-06-05 LAB — URINALYSIS, ROUTINE W REFLEX MICROSCOPIC
Bilirubin Urine: NEGATIVE
Glucose, UA: NEGATIVE mg/dL
Ketones, ur: NEGATIVE mg/dL
Leukocytes, UA: NEGATIVE
Nitrite: NEGATIVE
Protein, ur: NEGATIVE mg/dL
Specific Gravity, Urine: 1.018 (ref 1.005–1.030)
Urobilinogen, UA: 0.2 mg/dL (ref 0.0–1.0)
pH: 6 (ref 5.0–8.0)

## 2010-06-05 LAB — URINE MICROSCOPIC-ADD ON

## 2010-06-05 LAB — DIFFERENTIAL
Basophils Absolute: 0 10*3/uL (ref 0.0–0.1)
Basophils Relative: 1 % (ref 0–1)
Eosinophils Absolute: 0 10*3/uL (ref 0.0–0.7)
Eosinophils Relative: 1 % (ref 0–5)
Lymphocytes Relative: 29 % (ref 12–46)
Lymphs Abs: 2.2 10*3/uL (ref 0.7–4.0)
Monocytes Absolute: 0.6 10*3/uL (ref 0.1–1.0)
Monocytes Relative: 8 % (ref 3–12)
Neutro Abs: 4.8 10*3/uL (ref 1.7–7.7)
Neutrophils Relative %: 62 % (ref 43–77)

## 2010-06-05 LAB — CBC
HCT: 42.3 % (ref 36.0–46.0)
Hemoglobin: 14.4 g/dL (ref 12.0–15.0)
MCHC: 33.9 g/dL (ref 30.0–36.0)
MCV: 91.8 fL (ref 78.0–100.0)
Platelets: 219 10*3/uL (ref 150–400)
RBC: 4.61 MIL/uL (ref 3.87–5.11)
RDW: 13.6 % (ref 11.5–15.5)
WBC: 7.8 10*3/uL (ref 4.0–10.5)

## 2010-06-05 LAB — COMPREHENSIVE METABOLIC PANEL
ALT: 16 U/L (ref 0–35)
AST: 20 U/L (ref 0–37)
Albumin: 3.9 g/dL (ref 3.5–5.2)
Alkaline Phosphatase: 56 U/L (ref 39–117)
BUN: 7 mg/dL (ref 6–23)
CO2: 23 mEq/L (ref 19–32)
Calcium: 9 mg/dL (ref 8.4–10.5)
Chloride: 106 mEq/L (ref 96–112)
Creatinine, Ser: 0.62 mg/dL (ref 0.4–1.2)
GFR calc Af Amer: >60 mL/min (ref >60)
GFR calc non Af Amer: >60 mL/min (ref >60)
Glucose, Bld: 99 mg/dL (ref 70–99)
Potassium: 3.2 mEq/L — ABNORMAL LOW (ref 3.5–5.1)
Sodium: 138 mEq/L (ref 135–145)
Total Bilirubin: 1.2 mg/dL (ref 0.3–1.2)
Total Protein: 6.9 g/dL (ref 6.0–8.3)

## 2010-06-05 LAB — URINE CULTURE: Colony Count: 30000

## 2010-11-27 IMAGING — US US EXTREM UP*L* LTD
1 series · 14 of 16 positions shown · non-contrast
Comparison: None.

CLINICAL DATA: Recurrent left axillary pain and history of
hamartoma resection.

ULTRASOUND LEFT UPPER EXTREMITY LIMITED
TECHNIQUE: Ultrasound examination of the region of interest in the
left upper extremity was performed.

[Series 1: us extrem up*left* ltd · 0.13mm/px · 14 of 16 slices shown]
[im 1/16]
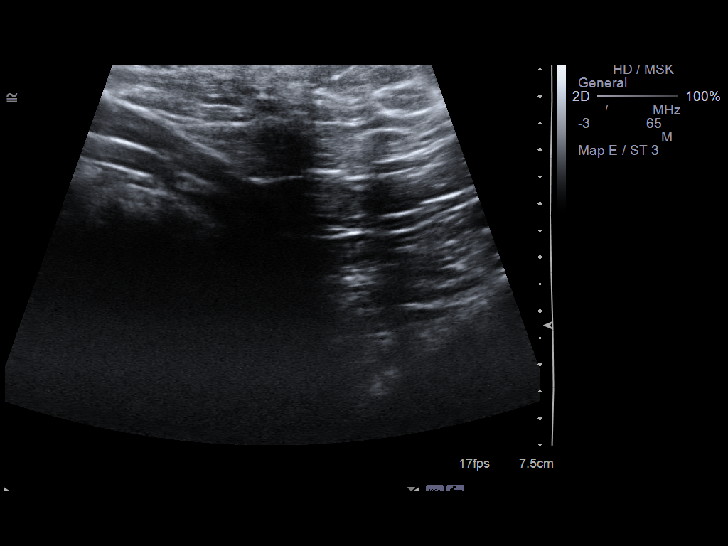
[im 2/16]
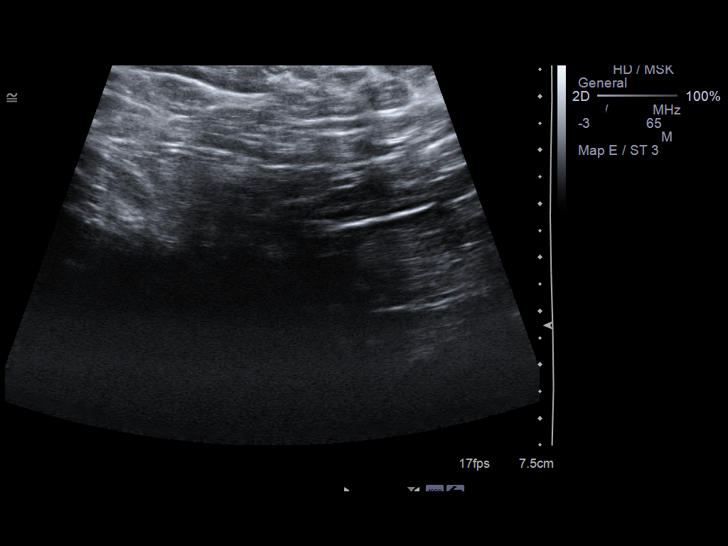
[im 3/16]
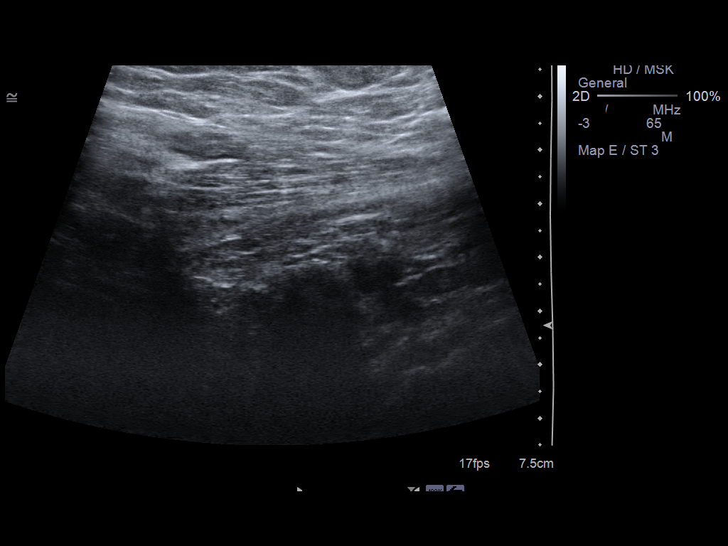
[im 5/16]
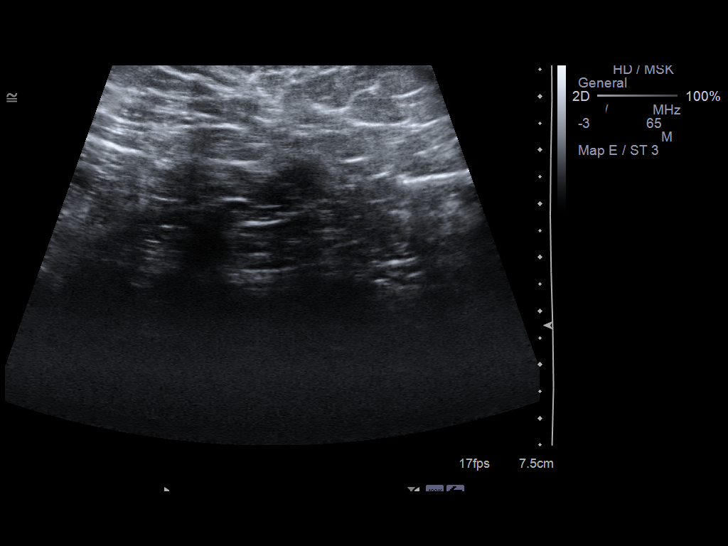
[im 6/16]
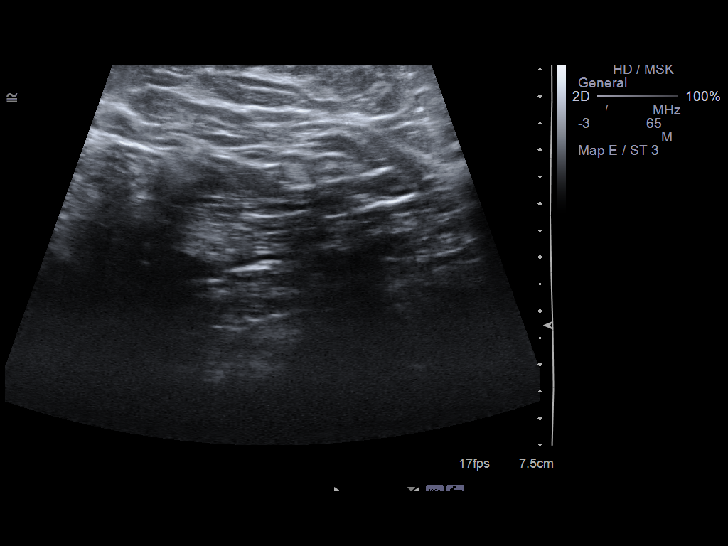
[im 7/16]
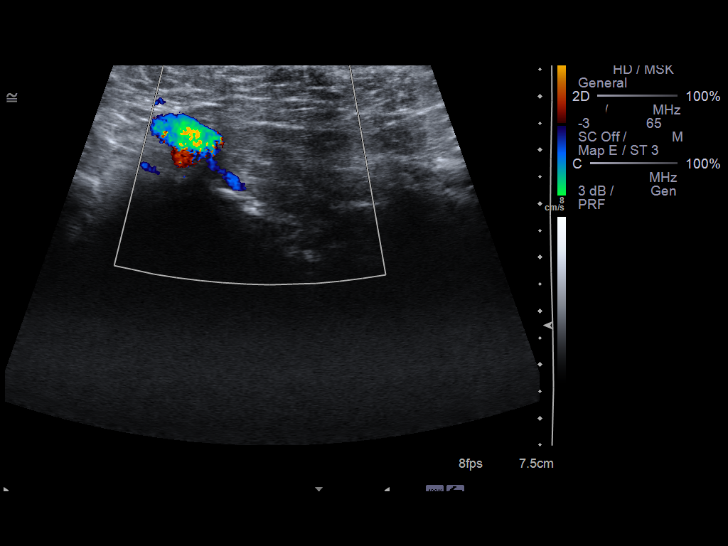
[im 8/16]
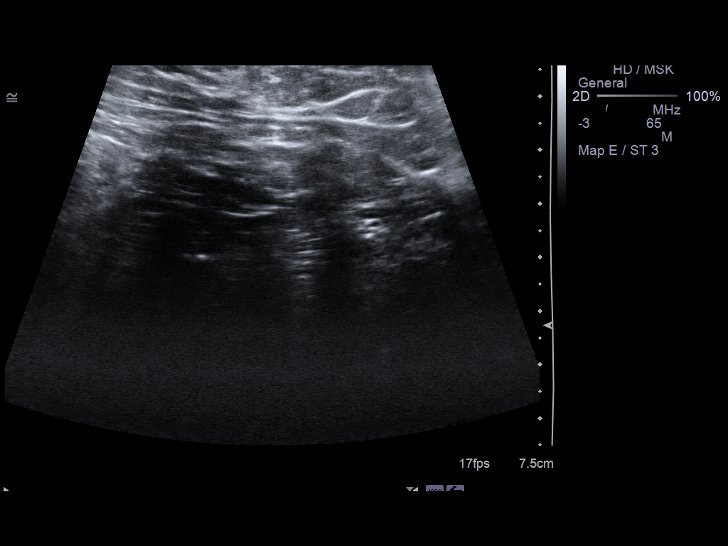
[im 9/16]
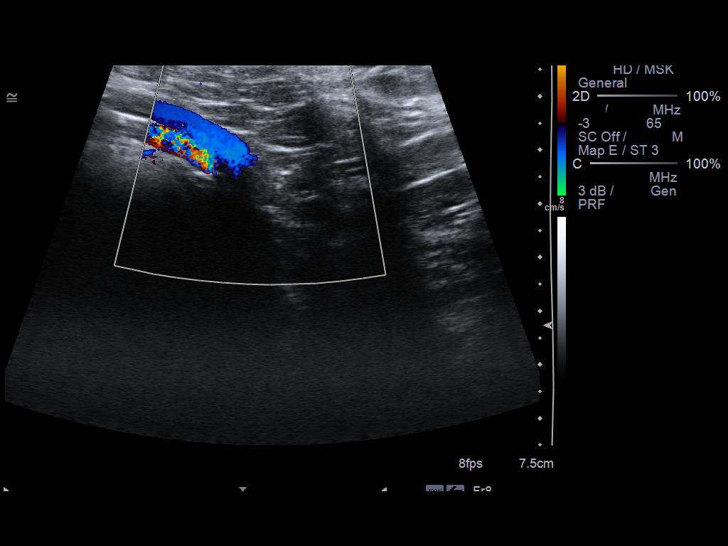
[im 10/16]
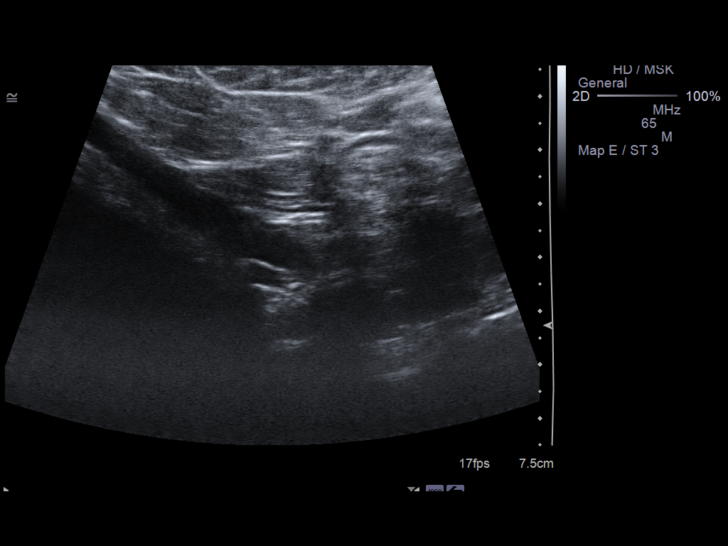
[im 11/16]
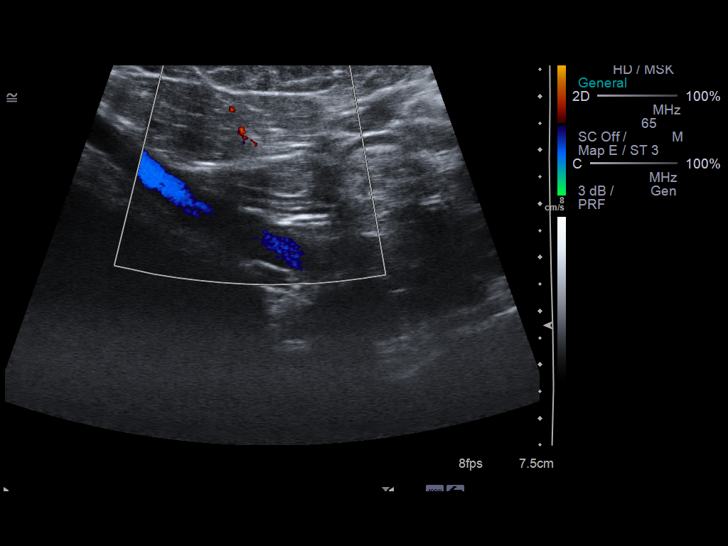
[im 13/16]
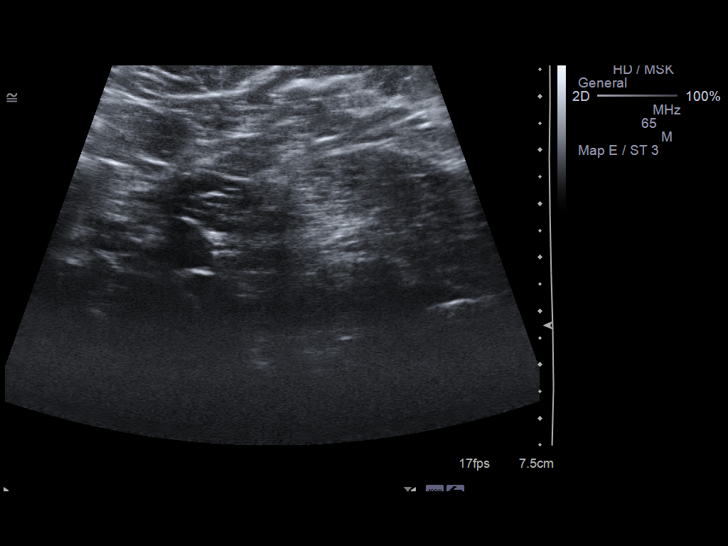
[im 14/16]
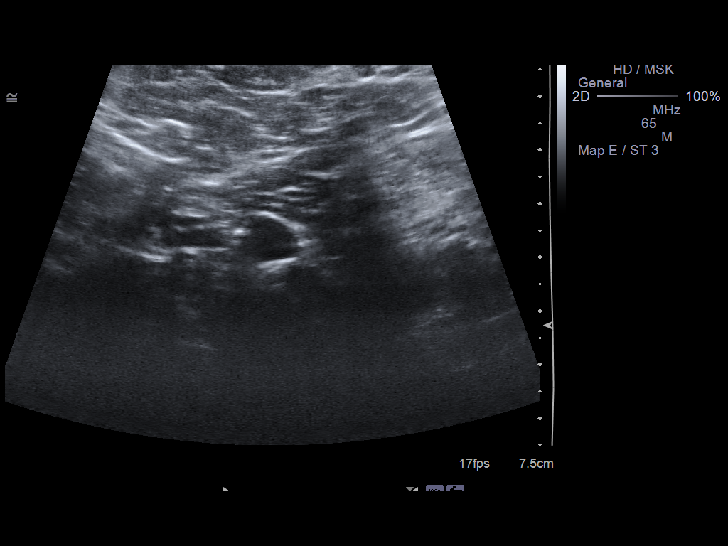
[im 15/16]
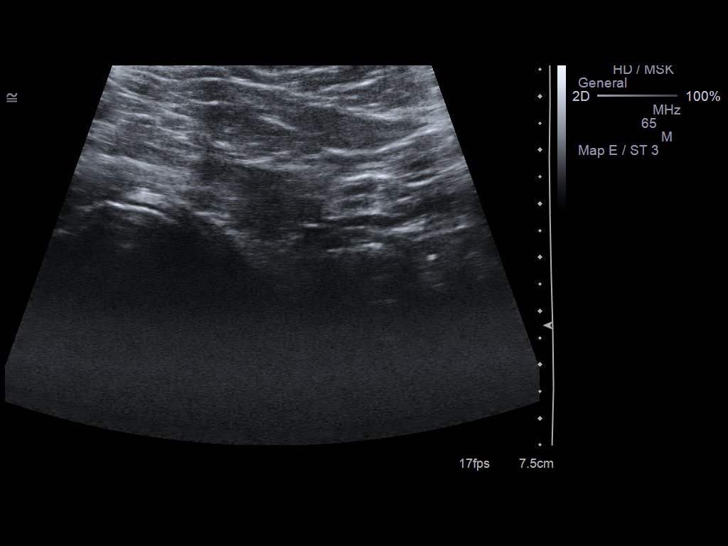
[im 16/16]
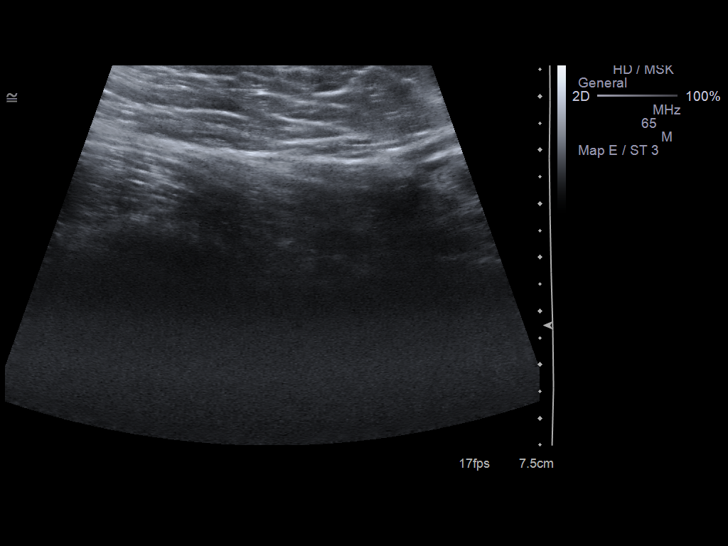

[14 of 16 positions shown; findings below may reference images not displayed]

FINDINGS: Examination was directed by the patient to the left
axilla in the region of prior surgery.  By ultrasound, there is no
evidence of underlying mass or fluid collection.  No enlarged
axillary lymph nodes identified.  Normal axillary vessels are
identified.  No vascular abnormalities.
IMPRESSION: Unremarkable left upper extremity ultrasound which was focused to
the region of the left axilla.

## 2011-06-07 ENCOUNTER — Emergency Department (HOSPITAL_COMMUNITY)
Admission: EM | Admit: 2011-06-07 | Discharge: 2011-06-07 | Disposition: A | Discharge status: Home / Self Care | Payer: Self-pay | Attending: Emergency Medicine | Admitting: Emergency Medicine

## 2011-06-07 ENCOUNTER — Emergency Department (HOSPITAL_COMMUNITY): Payer: Self-pay

## 2011-06-07 ENCOUNTER — Encounter (HOSPITAL_COMMUNITY): Payer: Self-pay

## 2011-06-07 DIAGNOSIS — S92909A Unspecified fracture of unspecified foot, initial encounter for closed fracture: Secondary | ICD-10-CM | POA: Insufficient documentation

## 2011-06-07 DIAGNOSIS — S92902A Unspecified fracture of left foot, initial encounter for closed fracture: Secondary | ICD-10-CM

## 2011-06-07 HISTORY — DX: Migraine, unspecified, not intractable, without status migrainosus: G43.909

## 2011-06-07 MED ORDER — OXYCODONE-ACETAMINOPHEN 5-325 MG PO TABS: 1.0000 | ORAL_TABLET | Freq: Four times a day (QID) | ORAL | Status: AC | PRN

## 2011-06-07 NOTE — ED Provider Notes (Signed)
History     CSN: [id number]  Arrival date & time 06/07/11  1135   First MD Initiated Contact with Patient 06/07/11 1241      Chief Complaint  Patient presents with  . Fall    (Consider location/radiation/quality/duration/timing/severity/associated sxs/prior treatment) Patient is a 51 y.o. female presenting with fall. The history is provided by the patient.  Fall The accident occurred 1 to 2 hours ago. Pertinent negatives include no numbness.   patient fell getting out of her car this morning. She injured her left ankle. The foot twisted over. The car was not moving. There is no other injuries. Her daughter translated for her.  Past Medical History  Diagnosis Date  . Migraines     History reviewed. No pertinent past surgical history.  No family history on file.  History  Substance Use Topics  . Smoking status: Never Smoker   . Smokeless tobacco: Not on file  . Alcohol Use: No    OB History    Grav Para Term Preterm Abortions TAB SAB Ect Mult Living                  Review of Systems  Musculoskeletal: Positive for joint swelling.  Neurological: Negative for weakness and numbness.    Allergies  Review of patient's allergies indicates no known allergies.  Home Medications   Current Outpatient Rx  Name Route Sig Dispense Refill  . OXYCODONE-ACETAMINOPHEN 5-325 MG PO TABS Oral Take 1-2 tablets by mouth every 6 (six) hours as needed for pain. 10 tablet 0  . PRESCRIPTION MEDICATION Oral Take 1 tablet by mouth 2 (two) times daily as needed. For migraines, for 8 days, does not have, does not know name      BP 128/87  Pulse 78  Temp 98.1 F (36.7 C)  Resp 22  SpO2 98%  LMP 06/02/2011  Physical Exam  Constitutional: She appears well-developed and well-nourished.  HENT:  Head: Atraumatic.  Musculoskeletal: She exhibits edema and tenderness.       Tenderness over her left ankle and foot. More swelling from below lateral malleolus to left mid foot.    ED  Course  Procedures (including critical care time)  Labs Reviewed - No data to display Dg Ankle Complete Left  06/07/2011  *RADIOLOGY REPORT*  Clinical Data: Ankle pain and swelling status post fall.  LEFT ANKLE COMPLETE - 3+ VIEW  Comparison: None.  Findings: There is lateral soft tissue swelling at the ankle, extending into the dorsal midfoot. The AP view demonstrates an avulsion fracture laterally from the midfoot.  This is not well seen on the additional views but may be arising from the calcaneus or the cuboid.  On the oblique view, there is also slight irregularity of the navicular. The distal tibia and fibula are intact.  The talar dome is intact.  IMPRESSION: Lateral midfoot avulsion fracture, probably from the calcaneus or cuboid. Associated soft tissue swelling.  Original Report Authenticated By: Gerrianne Scale, M.D.   Dg Foot Complete Left  06/07/2011  *RADIOLOGY REPORT*  Clinical Data: Foot and ankle pain and swelling status post fall today.  LEFT FOOT - COMPLETE 3+ VIEW  Comparison: Ankle radiographs same date.  Findings: There is dorsal midfoot soft tissue swelling.  Better seen on the ankle radiographs is an avulsion fracture laterally from the midfoot, probably from the calcaneus or the cuboid.  There is mild irregularity of the navicular adjacent to the medial cuneiform which could reflect a small avulsion fracture.  No forefoot fracture or dislocation is identified.  IMPRESSION: Lateral midfoot avulsion fracture (better seen on ankle radiographs).  Possible small navicular fracture.  No forefoot abnormalities identified.  Original Report Authenticated By: Gerrianne Scale, M.D.     1. Foot fracture, left       MDM  Patient twisted her ankle. Swelling on her foot and ankle. Possible avulsion fractures of the midfoot. She'll follow up with orthopedic surgery and was given a Cam Walker.        Juliet Rude. Rubin Payor, MD 06/07/11 1441

## 2011-06-07 NOTE — ED Notes (Signed)
Pt. Speaks no [place], daughter interupter

## 2011-06-07 NOTE — Progress Notes (Signed)
Orthopedic Tech Progress Note Patient Details:  Eileen Lucas 12-Jun-1960 [id number]  Other Ortho Devices Type of Ortho Device: CAM walker Ortho Device Location: left foot Ortho Device Interventions: Application   Eileen Lucas 06/07/2011, 3:01 PM

## 2011-06-07 NOTE — ED Notes (Signed)
Pt. Larey Seat out of a car this am and injured her lt. Ankle.   Lt. Ankle is swollen and deformed.  Ice pak applied.

## 2011-06-07 NOTE — Discharge Instructions (Signed)
Foot Fracture Your caregiver has diagnosed you as having a foot fracture (broken bone). Your foot has many bones. You have a fracture, or break, in one of these bones. In some cases, your doctor may put on a splint or removable fracture boot until the swelling in your foot has lessened. A cast may or may not be required. HOME CARE INSTRUCTIONS  If you do not have a cast or splint:  You may bear weight on your injured foot as tolerated or advised.   Do not put any weight on your injured foot for as long as directed by your caregiver. Slowly increase the amount of time you walk on the foot as the pain and swelling allows or as advised.   Use crutches until you can bear weight without pain. A gradual increase in weight bearing may help.   Apply ice to the injury for 15 to 20 minutes each hour while awake for the first 2 days. Put the ice in a plastic bag and place a towel between the bag of ice and your skin.   If an ace bandage (stretchy, elastic wrapping bandage) was applied, you may re-wrap it if ankle is more painful or your toes become cold and swollen.  If you have a cast or splint:  Use your crutches for as long as directed by your caregiver.   To lessen the swelling, keep the injured foot elevated on pillows while lying down or sitting. Elevate your foot above your heart.   Apply ice to the injury for 15 to 20 minutes each hour while awake for the first 2 days. Put the ice in a plastic bag and place a thin towel between the bag of ice and your cast.   Plaster or fiberglass cast:   Do not try to scratch the skin under the cast using a sharp or pointed object down the cast.   Check the skin around the cast every day. You may put lotion on any red or sore areas.   Keep your cast clean and dry.   Plaster splint:   Wear the splint until you are seen for a follow-up examination.   You may loosen the elastic around the splint if your toes become numb, tingle, or turn blue or cold. Do  not rest it on anything harder than a pillow in the first 24 hours.   Do not put pressure on any part of your splint. Use your crutches as directed.   Keep your splint dry. It can be protected during bathing with a plastic bag. Do not lower the splint into water.   If you have a fracture boot you may remove it to shower. Bear weight only as instructed by your caregiver.   Only take over-the-counter or prescription medicines for pain, discomfort, or fever as directed by your caregiver.  SEEK IMMEDIATE MEDICAL CARE IF:   Your cast gets damaged or breaks.   You have continued severe pain or more swelling than you did before the cast was put on.   Your skin or nails of your casted foot turn blue, gray, feel cold or numb.   There is a bad smell from your cast.   There is severe pain with movement of your toes.   There are new stains and/or drainage coming from under the cast.  MAKE SURE YOU:   Understand these instructions.   Will watch your condition.   Will get help right away if you are not doing well or get   worse.  Document Released: 02/08/2000 Document Revised: 01/30/2011 Document Reviewed: 03/16/2008 [employer] Patient Information 2012 [employer]. 

## 2012-02-16 ENCOUNTER — Encounter (HOSPITAL_COMMUNITY): Payer: Self-pay | Admitting: *Deleted

## 2012-02-16 ENCOUNTER — Emergency Department (HOSPITAL_COMMUNITY)
Admission: EM | Admit: 2012-02-16 | Discharge: 2012-02-16 | Disposition: A | Discharge status: Home / Self Care | Payer: Self-pay | Source: Home / Self Care | Attending: Emergency Medicine | Admitting: Emergency Medicine

## 2012-02-16 DIAGNOSIS — M67919 Unspecified disorder of synovium and tendon, unspecified shoulder: Secondary | ICD-10-CM

## 2012-02-16 MED ORDER — MELOXICAM 7.5 MG PO TABS: 7.5000 mg | ORAL_TABLET | Freq: Every day | ORAL | Status: DC

## 2012-02-16 MED ORDER — TRAMADOL HCL 50 MG PO TABS: 50.0000 mg | ORAL_TABLET | Freq: Four times a day (QID) | ORAL | Status: DC | PRN

## 2012-02-16 NOTE — ED Notes (Signed)
C/o pain L shoulder down to L elbow x 1 month.  No known injury.  She went to [facility] 12/9.  They did an xray and gave her 1 medication but does not know the name without relief.

## 2012-02-16 NOTE — ED Provider Notes (Signed)
History     CSN: [id number]  Arrival date & time 02/16/12  1509   First MD Initiated Contact with Patient 02/16/12 1621      No chief complaint on file.   (Consider location/radiation/quality/duration/timing/severity/associated sxs/prior treatment) HPI Comments: Patient presents urgent care this evening complaining of ongoing left shoulder pain for several months ( granddaughter states 4 months), she has been seen by her primary care Dr. or [facility], and they have done some x-rays of her shoulder she describes have been normal. She describes pain with certain movements and activity of her left arm. Describes that she cannot bend her arm to touch her back. She denies any falls or injuries previous shoulder problems or surgeries. She was taken a medicine prescribed by her doctor that she does not recall the name that did not help. She denies any chest pains, nausea vomiting or sweating. Also denies constitutional symptoms such as fevers, unintentional weight loss or changes in appetite.  Patient is a 51 y.o. female presenting with shoulder pain. The history is provided by the patient and a significant other. No language interpreter was used.  Shoulder Pain This is a new problem. The problem occurs constantly. The problem has not changed since onset.Pertinent negatives include no chest pain, no abdominal pain, no headaches and no shortness of breath. Exacerbated by: movement. The symptoms are relieved by rest. Treatments tried: unknown, medicien by PCP?Marland Kitchen.    Past Medical History  Diagnosis Date  . Migraines     No past surgical history on file.  No family history on file.  History  Substance Use Topics  . Smoking status: Never Smoker   . Smokeless tobacco: Not on file  . Alcohol Use: No    OB History    Grav Para Term Preterm Abortions TAB SAB Ect Mult Living                  Review of Systems  Constitutional: Positive for activity change. Negative for fever, chills,  diaphoresis, appetite change, fatigue and unexpected weight change.  HENT: Negative for neck pain and neck stiffness.   Respiratory: Negative for shortness of breath.   Cardiovascular: Negative for chest pain.  Gastrointestinal: Negative for abdominal pain.  Musculoskeletal: Negative for back pain, joint swelling and gait problem.  Skin: Negative for color change, pallor, rash and wound.  Neurological: Negative for dizziness, seizures, speech difficulty, weakness, light-headedness, numbness and headaches.    Allergies  Review of patient's allergies indicates no known allergies.  Home Medications   Current Outpatient Rx  Name  Route  Sig  Dispense  Refill  . PRESCRIPTION MEDICATION   Oral   Take 1 tablet by mouth 2 (two) times daily as needed. For migraines, for 8 days, does not have, does not know name           BP 131/75  Pulse 68  Temp 98.1 F (36.7 C) (Oral)  Resp 20  SpO2 97%  Physical Exam  Constitutional: Vital signs are normal. She appears well-developed and well-nourished. She is active.  Non-toxic appearance.  HENT:  Head: Normocephalic.  Mouth/Throat: No oropharyngeal exudate.  Eyes: Conjunctivae normal are normal.  Neck: Neck supple.  Musculoskeletal:       Left shoulder: She exhibits decreased range of motion, tenderness, bony tenderness and pain. She exhibits no swelling, no effusion, no crepitus, no deformity, no laceration, no spasm, normal pulse and normal strength.       Arms: Neurological: She is alert.  Skin:  No rash noted. No erythema.    ED Course  Procedures (including critical care time)  Labs Reviewed - No data to display No results found.   No diagnosis found.    MDM  Rotator cuff syndrome. Physical exam does not seem to be consistent with a tear. Will prescribe patient on NSAID and advised to followup with orthopedic Dr.Primary care Dr. have already done x-rays. rX OF MELOXICAM X WEEKS ( ADVISED TO STOP OTHER MEDICINES AND NOT TO  TAKE OVER THE COUNTER nsaids)  Jimmie Molly, MD 02/16/12 [phone]

## 2012-03-17 ENCOUNTER — Emergency Department (HOSPITAL_COMMUNITY)
Admission: EM | Admit: 2012-03-17 | Discharge: 2012-03-17 | Disposition: A | Discharge status: Home / Self Care | Payer: No Typology Code available for payment source | Source: Home / Self Care | Attending: Family Medicine | Admitting: Family Medicine

## 2012-03-17 ENCOUNTER — Encounter (HOSPITAL_COMMUNITY): Payer: Self-pay

## 2012-03-17 DIAGNOSIS — M25519 Pain in unspecified shoulder: Secondary | ICD-10-CM

## 2012-03-17 DIAGNOSIS — M25512 Pain in left shoulder: Secondary | ICD-10-CM

## 2012-03-17 DIAGNOSIS — M7500 Adhesive capsulitis of unspecified shoulder: Secondary | ICD-10-CM

## 2012-03-17 MED ORDER — NAPROXEN 500 MG PO TABS: 500.0000 mg | ORAL_TABLET | Freq: Two times a day (BID) | ORAL | Status: DC

## 2012-03-17 MED ORDER — HYDROCODONE-ACETAMINOPHEN 7.5-325 MG PO TABS: 1.0000 | ORAL_TABLET | Freq: Three times a day (TID) | ORAL | Status: DC | PRN

## 2012-03-17 NOTE — ED Provider Notes (Signed)
History     CSN: [id number]  Arrival date & time 03/17/12  1621   First MD Initiated Contact with Patient 03/17/12 1646      Chief Complaint  Patient presents with  . Shoulder Pain    (Consider location/radiation/quality/duration/timing/severity/associated sxs/prior treatment) Patient is a 52 y.o. female presenting with shoulder pain. The history is provided by the patient. The history is limited by a language barrier. A language interpreter was used.  Shoulder Pain This is a chronic problem. The problem occurs constantly. The problem has not changed since onset.Associated symptoms include headaches. The symptoms are aggravated by twisting. Nothing relieves the symptoms. She has tried acetaminophen and rest for the symptoms. The treatment provided no relief.   Patient is presenting today because she's having persistent left shoulder pain.  She's having decreasing mobility in the left shoulder.  This is been a chronic ongoing problem.  She was last seen in the urgent care Center in December 2013.  She was given a prescription for Mobic 7.5 mg 1 by mouth daily.  She reports that that was no significant improvement in her symptoms.  She reports that she took all the medications but still having significant pain.  She was supposed to followup with an orthopedic specialist but she reports that when she called the office of the orthopedist she was not able to get an appointment because she did not have medical insurance and did not have the money to pay up front for the appointment.  Past Medical History  Diagnosis Date  . Migraines     History reviewed. No pertinent past surgical history.  No family history on file.  History  Substance Use Topics  . Smoking status: Never Smoker   . Smokeless tobacco: Not on file  . Alcohol Use: No    OB History    Grav Para Term Preterm Abortions TAB SAB Ect Mult Living                 Review of Systems  Musculoskeletal: Positive for  arthralgias.       Significant left shoulder pain and immobility  Neurological: Positive for headaches.  All other systems reviewed and are negative.    Allergies  Review of patient's allergies indicates no known allergies.  Home Medications   Current Outpatient Rx  Name  Route  Sig  Dispense  Refill  . HYDROCODONE-ACETAMINOPHEN 7.5-325 MG PO TABS   Oral   Take 1 tablet by mouth every 8 (eight) hours as needed for pain (shoulder pain).   40 tablet   0   . NAPROXEN 500 MG PO TABS   Oral   Take 1 tablet (500 mg total) by mouth 2 (two) times daily with a meal.   3 tablet   0     BP 146/92  Pulse 86  Temp 97.8 F (36.6 C) (Oral)  Resp 17  SpO2 100%  Physical Exam  Nursing note and vitals reviewed. Constitutional: She is oriented to person, place, and time. She appears well-developed and well-nourished. No distress.  HENT:  Head: Normocephalic and atraumatic.  Eyes: EOM are normal. Pupils are equal, round, and reactive to light.  Neck: Normal range of motion. Neck supple.  Cardiovascular: Normal rate.   Pulmonary/Chest: Effort normal and breath sounds normal.  Musculoskeletal:       Left shoulder: She exhibits decreased range of motion, tenderness, bony tenderness, crepitus, pain and decreased strength. She exhibits no swelling, no effusion, no deformity and normal pulse.  Arms: Neurological: She is alert and oriented to person, place, and time.    ED Course  Procedures (including critical care time)  Labs Reviewed - No data to display No results found.   1. Left shoulder pain   2. Frozen shoulder   3.  Elevated BP   MDM  Pt is having significant left shoulder pain.  The patient needs to see an orthopedic specialist.  Will have her to start taking naproxen 500 mg by mouth twice a day with food.  She reports that she is having difficulty affording the meloxicam because it costs about $50 for the first prescription.  In addition, I gave her prescription for  hydrocodone 7.5 mg to take one tablet every 6 hours as needed for severe pain.  Requesting records from her recent shoulder x-rays from [facility].  We'll make an attempt to try and get an orthopedic appointment but unfortunately none of the local clinics her except in patients with no medical insurance on the [place] discounted program.  The patient reports she does not have the money to pay $150-$200 up front for an appointment.  In addition, the patient is working to try and get Medicaid benefits through the Department of Social Services.  If she gets those benefits soon we will be able to make her an appointment for an orthopedist.  I asked the patient to go to the emergency room if her symptoms became acutely worsen.  She verbalized understanding.           Cleora Fleet, MD 03/17/12 1728

## 2012-03-17 NOTE — ED Notes (Signed)
Patient complains of left shoulder pain past couple of months

## 2012-06-02 ENCOUNTER — Emergency Department (HOSPITAL_COMMUNITY)
Admission: EM | Admit: 2012-06-02 | Discharge: 2012-06-02 | Disposition: A | Discharge status: Home / Self Care | Payer: BC Managed Care – PPO | Source: Home / Self Care | Attending: Emergency Medicine | Admitting: Emergency Medicine

## 2012-06-02 ENCOUNTER — Encounter (HOSPITAL_COMMUNITY): Payer: Self-pay | Admitting: Emergency Medicine

## 2012-06-02 DIAGNOSIS — M7502 Adhesive capsulitis of left shoulder: Secondary | ICD-10-CM

## 2012-06-02 DIAGNOSIS — M7500 Adhesive capsulitis of unspecified shoulder: Secondary | ICD-10-CM

## 2012-06-02 MED ORDER — TRAMADOL HCL 50 MG PO TABS: 100.0000 mg | ORAL_TABLET | Freq: Three times a day (TID) | ORAL | Status: DC | PRN

## 2012-06-02 MED ORDER — METHYLPREDNISOLONE ACETATE 40 MG/ML IJ SUSP
INTRAMUSCULAR | Status: AC
  Filled 2012-06-02: qty 5

## 2012-06-02 NOTE — ED Provider Notes (Signed)
Chief Complaint:   Chief Complaint  Patient presents with  . Shoulder Pain    History of Present Illness:   Eileen Lucas is a 52 year-old [place] female who speaks no [place]. She is accompanied by a family interpreter. She relates to the interpreter in a month history of left shoulder pain. She denies any injury. Reviewing her chart however, it seems that she's had problems with the shoulder off and on for about 3 years. She has had x-rays of the shoulder and taken various anti-inflammatories without much improvement. She has been referred to various orthopedist, but not been able to go because of required co-pays. She has limited range of motion and pain with abduction, flexion, and internal rotation.  Review of Systems:  Other than noted above, the patient denies any of the following symptoms: Systemic:  No fevers, chills, sweats, or aches.  No fatigue or tiredness. Musculoskeletal:  No joint pain, arthritis, bursitis, swelling, back pain, or neck pain. Neurological:  No muscular weakness, paresthesias, headache, or trouble with speech or coordination.  No dizziness.  PMFSH:  Past medical history, family history, social history, meds, and allergies were reviewed.    Physical Exam:   Vital signs:  BP 120/85  Pulse 73  Temp(Src) 98.5 F (36.9 C) (Oral)  Resp 16  SpO2 100% Gen:  Alert and oriented times 3.  In no distress. Musculoskeletal: She has pain to palpation laterally. No swelling or erythema. The shoulder has a limited range of motion in all directions compatible with adhesive capsulitis. Neer test was positive.  Hawkins test was positive.  Empty cans test was positive with weakness. Otherwise, all joints had a full a ROM with no swelling, bruising or deformity.  No edema, pulses full. Extremities were warm and pink.  Capillary refill was brisk.  Skin:  Clear, warm and dry.  No rash. Neuro:  Alert and oriented times 3.  Muscle strength was normal.  Sensation was intact to light  touch.   Procedure Note:  Verbal informed consent was obtained from the patient.  Risks and benefits were outlined with the patient.  Patient understands and accepts these risks.  Identity of the patient was confirmed verbally and by armband.    Procedure was performed as follows:  Posterior aspect of the shoulder was prepped with Betadine and alcohol and anesthetized with April: Ethyl chloride spray. In the subacromial space was entered with a 27-gauge 1/2 inch needle and 1 mL of Depo-Medrol in 40 mg strength and 2 mL of 2% Xylocaine were injected.  Patient tolerated the procedure well without any immediate complications.  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  Plan:   1.  The following meds were prescribed:   Discharge Medication List as of 06/02/2012  6:32 PM    START taking these medications   Details  traMADol (ULTRAM) 50 MG tablet Take 2 tablets (100 mg total) by mouth every 8 (eight) hours as needed for pain., Starting 06/02/2012, Until Discontinued, Normal       2.  The patient was instructed in symptomatic care, including rest and activity, elevation, application of ice and compression.  Appropriate handouts were given.  3.  The patient was told to return if becoming worse in any way, if no better in 3 or 4 days, and given some red flag symptoms such as increasing pain or neurological symptoms that would indicate earlier return.   4.  The patient was told to follow up with outpatient  physical therapy.    Reuben Likes, MD 06/02/12 2114

## 2012-06-02 NOTE — ED Notes (Signed)
Pain in left shoulder, no known injury.  Pain for 8 months

## 2012-06-02 NOTE — ED Notes (Signed)
MD at bedside. 

## 2012-06-27 ENCOUNTER — Emergency Department (HOSPITAL_COMMUNITY): Payer: BC Managed Care – PPO

## 2012-06-27 ENCOUNTER — Encounter (HOSPITAL_COMMUNITY): Payer: Self-pay | Admitting: Emergency Medicine

## 2012-06-27 ENCOUNTER — Emergency Department (HOSPITAL_COMMUNITY)
Admission: EM | Admit: 2012-06-27 | Discharge: 2012-06-27 | Disposition: A | Discharge status: Other Acute Inpatient Hospital | Payer: BC Managed Care – PPO | Source: Home / Self Care | Attending: Family Medicine | Admitting: Family Medicine

## 2012-06-27 ENCOUNTER — Emergency Department (HOSPITAL_COMMUNITY)
Admission: EM | Admit: 2012-06-27 | Discharge: 2012-06-27 | Disposition: A | Discharge status: Home / Self Care | Payer: BC Managed Care – PPO | Attending: Emergency Medicine | Admitting: Emergency Medicine

## 2012-06-27 ENCOUNTER — Encounter (HOSPITAL_COMMUNITY): Payer: Self-pay | Admitting: *Deleted

## 2012-06-27 DIAGNOSIS — R079 Chest pain, unspecified: Secondary | ICD-10-CM

## 2012-06-27 DIAGNOSIS — Z8679 Personal history of other diseases of the circulatory system: Secondary | ICD-10-CM | POA: Insufficient documentation

## 2012-06-27 DIAGNOSIS — R002 Palpitations: Secondary | ICD-10-CM

## 2012-06-27 DIAGNOSIS — R0789 Other chest pain: Secondary | ICD-10-CM | POA: Insufficient documentation

## 2012-06-27 DIAGNOSIS — R0602 Shortness of breath: Secondary | ICD-10-CM | POA: Insufficient documentation

## 2012-06-27 LAB — BASIC METABOLIC PANEL
BUN: 8 mg/dL (ref 6–23)
CO2: 26 mEq/L (ref 19–32)
Calcium: 9.1 mg/dL (ref 8.4–10.5)
Chloride: 108 mEq/L (ref 96–112)
Creatinine, Ser: 0.54 mg/dL (ref 0.50–1.10)
GFR calc Af Amer: >90 mL/min (ref >90)
GFR calc non Af Amer: >90 mL/min (ref >90)
Glucose, Bld: 107 mg/dL — ABNORMAL HIGH (ref 70–99)
Potassium: 4 mEq/L (ref 3.5–5.1)
Sodium: 142 mEq/L (ref 135–145)

## 2012-06-27 LAB — CBC WITH DIFFERENTIAL/PLATELET
Basophils Absolute: 0 10*3/uL (ref 0.0–0.1)
Basophils Relative: 0 % (ref 0–1)
Eosinophils Absolute: 0.1 10*3/uL (ref 0.0–0.7)
Eosinophils Relative: 1 % (ref 0–5)
HCT: 38.3 % (ref 36.0–46.0)
Hemoglobin: 13.4 g/dL (ref 12.0–15.0)
Lymphocytes Relative: 31 % (ref 12–46)
Lymphs Abs: 2.4 10*3/uL (ref 0.7–4.0)
MCH: 30.8 pg (ref 26.0–34.0)
MCHC: 35 g/dL (ref 30.0–36.0)
MCV: 88 fL (ref 78.0–100.0)
Monocytes Absolute: 0.4 10*3/uL (ref 0.1–1.0)
Monocytes Relative: 6 % (ref 3–12)
Neutro Abs: 4.7 10*3/uL (ref 1.7–7.7)
Neutrophils Relative %: 62 % (ref 43–77)
Platelets: 273 10*3/uL (ref 150–400)
RBC: 4.35 MIL/uL (ref 3.87–5.11)
RDW: 13.1 % (ref 11.5–15.5)
WBC: 7.6 10*3/uL (ref 4.0–10.5)

## 2012-06-27 NOTE — ED Provider Notes (Signed)
History     CSN: [id number]  Arrival date & time 06/27/12  1242   First MD Initiated Contact with Patient 06/27/12 1250      No chief complaint on file.   (Consider location/radiation/quality/duration/timing/severity/associated sxs/prior treatment) HPI Patient is 52 y.o female from [place] who presents to Emergency department with chest pain.  Patient's son acts as interpreter.  Patient has had intermittent, substernal, sharp, chest pain for past 6 months.  Pain radiates to left back and down left abdomen.  Patient states pain occurs at rest, with exertion, with cough, and sometimes with movement.  Painful episodes last 2-3 minutes and then dissipate.  Patient states she occasionally becomes short of breath with chest pain.  Chest pain episodes occur every 5-6 days.  Denies fever, diaphoresis, edema, palpitations, syncope, nausea, dizziness, fever, weakness, headache, blurred vision, persistent cough, and abdominal pain.  She has not taken any medication for this problem.   Past Medical History  Diagnosis Date  . Migraines     No past surgical history on file.  No family history on file.  History  Substance Use Topics  . Smoking status: Never Smoker   . Smokeless tobacco: Not on file  . Alcohol Use: No    OB History   Grav Para Term Preterm Abortions TAB SAB Ect Mult Living                  Review of Systems All other systems negative except as documented in the HPI. All pertinent positives and negatives as reviewed in the HPI.   Allergies  Review of patient's allergies indicates no known allergies.  Home Medications   Current Outpatient Rx  Name  Route  Sig  Dispense  Refill  . traMADol (ULTRAM) 50 MG tablet   Oral   Take 100 mg by mouth every 8 (eight) hours as needed for pain.           BP 115/63  Pulse 84  Temp(Src) 97.9 F (36.6 C) (Oral)  Resp 16  SpO2 99%  LMP 04/27/2012  Physical Exam  Constitutional: She is oriented to person, place, and time.  She appears well-developed and well-nourished.  Eyes: Conjunctivae and EOM are normal. Pupils are equal, round, and reactive to light.  Neck: Normal range of motion.  Cardiovascular: Normal rate, regular rhythm, normal heart sounds and intact distal pulses.  Exam reveals no gallop and no friction rub.   No murmur heard. Pulmonary/Chest: Effort normal and breath sounds normal. No respiratory distress. She has no wheezes. She has no rales. She exhibits no tenderness.  Abdominal: Soft. Bowel sounds are normal. There is no tenderness.  Musculoskeletal: She exhibits no edema.  Lymphadenopathy:    She has no cervical adenopathy.  Neurological: She is alert and oriented to person, place, and time. She has normal reflexes.  Skin: Skin is warm and dry.    ED Course  Procedures (including critical care time)  Labs Reviewed  CBC WITH DIFFERENTIAL  BASIC METABOLIC PANEL   Patient has atypical symptoms for cardiac type chest pain, although I do feel, that she'll need cardiac workup.  Patient will be referred to cardiology.  Patient has the symptoms that lasted 2-3 minutes, several times a week for the last 6 months.  There is no definite cause identified by the patient.  Nothing seems to bring these episodes on exertion does not make her symptoms worse.  Patient is advised of her lab test findings and x-ray results.  Patient is  advised return here for any worsening in her condition.   MDM  MDM Reviewed: vitals and nursing note Interpretation: labs, x-ray and ECG   Date: 06/27/2012  Rate: 75  Rhythm: normal sinus rhythm  QRS Axis: normal  Intervals: normal  ST/T Wave abnormalities: nonspecific T wave changes  Conduction Disutrbances:none  Narrative Interpretation:   Old EKG Reviewed: none available           Carlyle Dolly, PA-C 06/27/12 1453

## 2012-06-27 NOTE — ED Notes (Signed)
Discharge instructions reviewed with pt and her son. Both verbalized understanding.

## 2012-06-27 NOTE — ED Provider Notes (Signed)
History     CSN: [id number]  Arrival date & time 06/27/12  1127   First MD Initiated Contact with Patient 06/27/12 1211      Chief Complaint  Patient presents with  . Chest Pain    (Consider location/radiation/quality/duration/timing/severity/associated sxs/prior treatment) Patient is a 52 y.o. female presenting with chest pain. The history is provided by the patient and a relative. The history is limited by a language barrier. Language interpreter used: son was interpretor.  Chest Pain Pain location:  Substernal area Pain quality: pressure   Pain radiates to:  Does not radiate Pain radiates to the back: no   Pain severity:  Moderate Onset quality:  Gradual Duration:  6 months Timing:  Intermittent Progression:  Worsening Chronicity:  Recurrent Associated symptoms: headache, palpitations and shortness of breath   Associated symptoms: no diaphoresis, no nausea and not vomiting     Past Medical History  Diagnosis Date  . Migraines     History reviewed. No pertinent past surgical history.  No family history on file.  History  Substance Use Topics  . Smoking status: Never Smoker   . Smokeless tobacco: Not on file  . Alcohol Use: No    OB History   Grav Para Term Preterm Abortions TAB SAB Ect Mult Living                  Review of Systems  Constitutional: Negative.  Negative for diaphoresis.  Respiratory: Positive for chest tightness and shortness of breath.   Cardiovascular: Positive for chest pain and palpitations.  Gastrointestinal: Negative for nausea and vomiting.  Neurological: Positive for headaches.    Allergies  Review of patient's allergies indicates no known allergies.  Home Medications   Current Outpatient Rx  Name  Route  Sig  Dispense  Refill  . traMADol (ULTRAM) 50 MG tablet   Oral   Take 2 tablets (100 mg total) by mouth every 8 (eight) hours as needed for pain.   30 tablet   0   . HYDROcodone-acetaminophen (NORCO) 7.5-325 MG per  tablet   Oral   Take 1 tablet by mouth every 8 (eight) hours as needed for pain (shoulder pain).   40 tablet   0   . naproxen (NAPROSYN) 500 MG tablet   Oral   Take 1 tablet (500 mg total) by mouth 2 (two) times daily with a meal.   3 tablet   0     BP 135/93  Pulse 78  Temp(Src) 97.6 F (36.4 C)  Resp 18  SpO2 100%  LMP 04/27/2012  Physical Exam  Nursing note and vitals reviewed. Constitutional: She is oriented to person, place, and time. She appears well-developed and well-nourished.  No pain or sx at present   Neck: Normal range of motion. Neck supple.  Cardiovascular: Normal rate, regular rhythm, normal heart sounds and intact distal pulses.   Pulmonary/Chest: Effort normal and breath sounds normal.  Neurological: She is alert and oriented to person, place, and time.  Skin: Skin is warm and dry.    ED Course  Procedures (including critical care time)  Labs Reviewed - No data to display No results found.   1. Chest pain, atypical   2. Heart palpitations       MDM  Sent for eval of cp and palpitations off and on for 6mos,, worse today.        Linna Hoff, MD 06/27/12 1228

## 2012-06-27 NOTE — ED Notes (Signed)
Pt comes from [facility] with c/o central cp that radiates to back. Per pt's son pt had this pain before but did not seek tx.

## 2012-06-27 NOTE — ED Provider Notes (Signed)
Medical screening examination/treatment/procedure(s) were performed by non-physician practitioner and as supervising physician I was immediately available for consultation/collaboration.    Jenette Rayson R Aziah Kaiser, MD 06/27/12 1553 

## 2012-06-27 NOTE — ED Notes (Signed)
Pt c/o central cp with radiation to back. Per pt's son pt has had this pain before but did not seek tx. Pt denies any cp at this time. Was sent over by [facility]

## 2012-06-27 NOTE — ED Notes (Signed)
Patient states that she has had chest pain and increased heart rate while resting. Pain is worse during inspiration. Denies dizziness, fever/chills.

## 2012-09-29 ENCOUNTER — Encounter: Payer: Self-pay | Admitting: Medical

## 2012-09-29 ENCOUNTER — Ambulatory Visit (INDEPENDENT_AMBULATORY_CARE_PROVIDER_SITE_OTHER): Payer: BC Managed Care – PPO | Admitting: Medical

## 2012-09-29 VITALS — BP 120/80 | HR 76 | Temp 98.2°F | Resp 16 | Wt 153.0 lb

## 2012-09-29 DIAGNOSIS — G8929 Other chronic pain: Secondary | ICD-10-CM

## 2012-09-29 DIAGNOSIS — G43909 Migraine, unspecified, not intractable, without status migrainosus: Secondary | ICD-10-CM

## 2012-09-29 DIAGNOSIS — M25519 Pain in unspecified shoulder: Secondary | ICD-10-CM

## 2012-09-29 MED ORDER — BUTALBITAL-APAP-CAFFEINE 50-325-40 MG PO TABS: ORAL_TABLET | ORAL | Status: DC

## 2012-09-29 MED ORDER — TOPIRAMATE 50 MG PO TABS: ORAL_TABLET | ORAL | Status: DC

## 2012-09-29 NOTE — Progress Notes (Signed)
Subjective: [name] is a 52yo [place] female here as a new patient today to establish care.  She is here with the interpreter today.  She is here for 2 issues, headaches and left shoulder pain.  She gets migraines ranging from 0-14 days out of the months.   typically gets bad migraine lasting 3 days, and some months can have 3 migraines lasting 3-4 days, causing her to be debilitated up to 14 days per month.   Some months gets no headaches though.   No specific trigger identified.  She does get associated nausea, photophobia, phonophobia, but no visual changes, no aura, no weakness, no numbness, no tingling.  Has been prescribed a variety of medications including narcotic pain medications, NSAIDs, tramadol, Fioricet.  The only preventative medication she recall using was Topamax although it didn't work well.  She doesn't recall ever seeing a headache specialist.  She was seen in the ED for headache 2010, had CT scan which was normal.   Currently her method of treatment is to rest in a cool dark room.   Here for left shoulder pain.  This has been a chronic problems.   Has seen urgent care a few times for similar.   Had xray, shoulder injection, NSAIDs,  And still hasn't had a lot of improvement.   Hurts to lift shoulder above 90 degrees and hurts to do work with left shoulder.   Past Medical History  Diagnosis Date  . Migraines    History reviewed. No pertinent family history. No current outpatient prescriptions on file.   No current facility-administered medications for this visit.   No Known Allergies History   Social History  . Marital Status: Married    Spouse Name: N/A    Number of Children: N/A  . Years of Education: N/A   Occupational History  . Not on file.   Social History Main Topics  . Smoking status: Never Smoker   . Smokeless tobacco: Not on file  . Alcohol Use: No  . Drug Use: No  . Sexually Active: Yes    Birth Control/ Protection: None   Other Topics Concern  .  Not on file   Social History Narrative  . No narrative on file   ROS as in subjective    Objective:   Physical Exam  Filed Vitals:   09/29/12 1547  BP: 120/80  Pulse: 76  Temp: 98.2 F (36.8 C)  Resp: 16    General appearance: alert, no distress, WD/WN, pleasant female HEENT: normocephalic, sclerae anicteric, PERRLA, EOMi, nares patent, no discharge or erythema, pharynx normal Oral cavity: MMM, upper dental bridge, no other lesions Neck: supple, no lymphadenopathy, no thyromegaly, no masses, no bruits Heart: RRR, normal S1, S2, no murmurs Lungs: CTA bilaterally, no wheezes, rhonchi, or rales Musculoskeletal: left shoulder tender at [facility] joint, upper lateral deltoid, pain with shoulder flexion abduction above 80 degrees, pain with drop arm test, pain with apprehension test, unable to really perform any other special tests due to pain.   Rest of bilat UE nontender, no swelling, no obvious deformity Extremities: no edema, no cyanosis, no clubbing Pulses: 2+ symmetric, upper and lower extremities, normal cap refill Neurological: alert, oriented x 3, CN2-12 intact, strength normal upper extremities and lower extremities, sensation normal throughout, DTRs 2+ throughout, no cerebellar signs, gait normal Psychiatric: normal affect, behavior normal, pleasant    Assessment and Plan :     Encounter Diagnoses  Name Primary?  . Migraine Yes  . Chronic shoulder  pain, left    Migraines - her symptoms seem very typical for migraine headaches without aura.  Discussed options for therapy.  Begin Topamax 50mg  BID, starting 25mg  BID x 1wk, then 50mg  BID.   Can use Fioricet prn for acute migraines .  Discussed risks/benefits of medication, goals of therapy.   F/u 47mo.  Chronic shoulder pain - based on exam, prior Urgent Care records, and symptoms, will refer to orthopedics to this chronic problem.   Follow-up 47mo

## 2012-11-17 ENCOUNTER — Encounter: Payer: Self-pay | Admitting: Medical

## 2012-11-17 ENCOUNTER — Ambulatory Visit (INDEPENDENT_AMBULATORY_CARE_PROVIDER_SITE_OTHER): Payer: BC Managed Care – PPO | Admitting: Medical

## 2012-11-17 VITALS — BP 128/82 | HR 80 | Temp 97.5°F | Resp 16 | Wt 152.0 lb

## 2012-11-17 DIAGNOSIS — G43909 Migraine, unspecified, not intractable, without status migrainosus: Secondary | ICD-10-CM

## 2012-11-17 MED ORDER — NORTRIPTYLINE HCL 25 MG PO CAPS: 25.0000 mg | ORAL_CAPSULE | Freq: Every day | ORAL | Status: DC

## 2012-11-17 MED ORDER — METHYLPREDNISOLONE (PAK) 4 MG PO TABS: ORAL_TABLET | ORAL | Status: DC

## 2012-11-17 MED ORDER — PROMETHAZINE HCL 25 MG PO TABS: 25.0000 mg | ORAL_TABLET | Freq: Three times a day (TID) | ORAL | Status: DC | PRN

## 2012-11-17 NOTE — Progress Notes (Signed)
Subjective: [name] is a 52yo [place] female here for recheck.  She is here with the interpreter today.  I saw her as a new patient in early August 2014 to establish and to discuss her chronic headaches.  Since her last visit there apparently was a little confusion as she is not taking any of the medications I prescribed right now.  She did end up using one month of Topamax at 50 mg twice a day, and used the whole bottle of Fioricet.  This lessened her headaches a little, but she didn't like the way Topamax made her feel.  Felt like it gave her more dizziness and uneasiness.   She is still getting frequent migraines, but recently she had a bad migraine about an hour after she had a steroid injection in her shoulder at the orthopedic office. She notes an hour after the procedure, had a generalized headache, felt like her hearing is muffled all day, had dizziness and nausea.  This headache started 2 days ago has persisted.  Headache is 5/10 pain. Currently she still has headache, some intermittent dizziness, photophobia at times, nausea at times, but denies numbness, weakness, tingling, vision or hearing changes at this point, no slurred speech, no allergy problems, no illness, and no other triggers.  She does have history of migraines starting in her teenage years.  She gets migraines ranging from 0-14 days out of the months.   typically gets bad migraine lasting 3 days, and some months can have 3 migraines lasting 3-4 days, causing her to be debilitated up to 14 days per month.   Some months gets no headaches though.   No specific trigger identified.  She does get associated nausea, photophobia, phonophobia, but no visual changes, no aura, no weakness, no numbness, no tingling.  Has been prescribed a variety of medications including narcotic pain medications, NSAIDs, tramadol, Fioricet.  The only preventative medication has been Topamax.  She doesn't recall ever seeing a headache specialist.  She was seen in  the ED for headache 2010, had CT scan which was normal.   Currently her method of treatment is to rest in a cool dark room.   ROS as in subjective    Objective:   Physical Exam  Filed Vitals:   11/17/12 0902  BP: 128/82  Pulse: 80  Temp: 97.5 F (36.4 C)  Resp: 16    General appearance: alert, no distress, WD/WN, pleasant female HEENT: normocephalic, sclerae anicteric, PERRLA, EOMi, nares patent, no discharge or erythema, pharynx normal Oral cavity: MMM, upper dental bridge, no other lesions Neck: supple, no lymphadenopathy, no thyromegaly, no masses, no bruits Heart: RRR, normal S1, S2, no murmurs Lungs: CTA bilaterally, no wheezes, rhonchi, or rales Pulses: 2+ symmetric, upper and lower extremities, normal cap refill Neurological: alert, oriented x 3, CN2-12 intact, strength normal upper extremities and lower extremities, sensation normal throughout, DTRs 2+ throughout, no cerebellar signs, gait normal;  Her peripheral vision seems to be about 45-60 degrees in all 4 quadrants, either eye.  She does wear glasses. Psychiatric: normal affect, behavior normal, pleasant    Assessment and Plan :     Encounter Diagnosis  Name Primary?  . Migraine headache Yes   Migraines - we discussed her symptoms, her current headache, her prior treatments.   She has already stopped Topamax and Fioricet.  Discussed options for therapy.  Begin nortriptyline 25 mg each bedtime for preventative therapy.  She'll begin Medrol Dosepak and when necessary Phenergan today to help break  the current headache. Discussed her case with Dr. Susann Givens supervising physician, and we advise that her headache is not likely related to her recent steroid injection in her shoulder.  I answered her questions. She will call back if her current headache doesn't resolve. Otherwise, Follow-up 43mo

## 2012-11-17 NOTE — Patient Instructions (Signed)
Begin Nortriptyline 25mg  capsules at bedtime for sleep and to help prevent migraine headaches.  Take this EVERY DAY.  For the next week, take the Medrol steroid dosepak.  This is to help calm down the current headache.  Use the Promethazine/Phenergan to help with nausea.   This will also make her sleepy.  This is just used AS NEEDED.   Recheck in 1 month.  If not improving, may need to see headache specialist.

## 2012-12-14 ENCOUNTER — Ambulatory Visit (INDEPENDENT_AMBULATORY_CARE_PROVIDER_SITE_OTHER): Payer: BC Managed Care – PPO | Admitting: Medical

## 2012-12-14 ENCOUNTER — Telehealth: Payer: Self-pay | Admitting: Family Medicine

## 2012-12-14 ENCOUNTER — Encounter: Payer: Self-pay | Admitting: Medical

## 2012-12-14 ENCOUNTER — Telehealth: Payer: Self-pay | Admitting: Medical

## 2012-12-14 VITALS — BP 122/80 | HR 69 | Temp 98.1°F | Resp 16 | Wt 151.0 lb

## 2012-12-14 DIAGNOSIS — R51 Headache: Secondary | ICD-10-CM

## 2012-12-14 DIAGNOSIS — R1013 Epigastric pain: Secondary | ICD-10-CM

## 2012-12-14 DIAGNOSIS — G8929 Other chronic pain: Secondary | ICD-10-CM

## 2012-12-14 MED ORDER — OMEPRAZOLE 40 MG PO CPDR: 40.0000 mg | DELAYED_RELEASE_CAPSULE | Freq: Every day | ORAL | Status: DC

## 2012-12-14 NOTE — Progress Notes (Signed)
Subjective: [name] is a 52yo female here for recheck.  She is here with her daughter Eileen Lucas who is interpreting today.   She is here for 2 issues, headaches and epigastric pain.     Since last visit saw no improvement on Nortriptyline 25mg  QHS or the acute round of phenergan and medrol dose pack at the start of the month for acute migraine.   Prior to that saw no improvement on low dose Topamax x 103mo.  She gets migraines ranging from 0-14 days out of the months.  Typically gets bad migraine lasting 3 days, and some months can have 3 migraines lasting 3-4 days, causing her to be debilitated up to 14 days per month.   Some months gets no headaches though.   No specific trigger identified.  She does get associated nausea, photophobia, phonophobia, but no visual changes, no aura, no weakness, no numbness, no tingling.  Has been prescribed a variety of medications including narcotic pain medications, NSAIDs, tramadol, Fioricet.  She has never seen a headache specialist.  She was seen in the ED for headache 2010, had CT scan which was normal.   Currently her method of treatment is to rest in a cool dark room.    She notes being on omeprazole 2 years ago for reflux, got better, and stopped the medication.  Now after 2 years has similar epigastric pain, increased belching, and sometimes worse after eating.   Denies fever, nausea, vomiting, diarrhea, constipation, or changes in bowels.  No back pain.  Denies increase in fatty, greasy, or spicy foods.    Past Medical History  Diagnosis Date  . Migraines    History reviewed. No pertinent past surgical history.  History reviewed. No pertinent family history. Current Outpatient Prescriptions  Medication Sig Dispense Refill  . omeprazole (PRILOSEC) 40 MG capsule Take 1 capsule (40 mg total) by mouth daily.  30 capsule  1   No current facility-administered medications for this visit.   No Known Allergies History   Social History  . Marital Status:  Married    Spouse Name: N/A    Number of Children: N/A  . Years of Education: N/A   Occupational History  . Not on file.   Social History Main Topics  . Smoking status: Never Smoker   . Smokeless tobacco: Not on file  . Alcohol Use: No  . Drug Use: No  . Sexual Activity: Yes    Birth Control/ Protection: None   Other Topics Concern  . Not on file   Social History Narrative  . No narrative on file   ROS as in subjective    Objective:   Physical Exam  BP 122/80  Pulse 69  Temp(Src) 98.1 F (36.7 C) (Oral)  Resp 16  Wt 151 lb (68.493 kg)  BMI 29.49 kg/m2  General appearance: alert, no distress, WD/WN, pleasant female HEENT: normocephalic, sclerae anicteric, PERRLA, EOMi, nares patent, no discharge or erythema, pharynx normal Oral cavity: MMM, upper dental bridge, no other lesions Neck: supple, no lymphadenopathy, no thyromegaly, no masses, no bruits Heart: RRR, normal S1, S2, no murmurs Lungs: CTA bilaterally, no wheezes, rhonchi, or rales Abdomen: +bs, soft, mild epigastric and RUQ tenderness, otherwise nontender, no mass, no organomegaly Back: nontender Extremities: no edema, no cyanosis, no clubbing Pulses: 2+ symmetric, upper and lower extremities, normal cap refill Neurological: alert, oriented x 3, CN2-12 intact, strength normal upper extremities and lower extremities, sensation normal throughout, DTRs 2+ throughout, no cerebellar signs, gait normal Psychiatric:  normal affect, behavior normal, pleasant    Assessment and Plan :     Encounter Diagnoses  Name Primary?  . Abdominal pain, epigastric Yes  . Chronic headache    Abdominal pain, epigastric - begin Omeprazole, avoid GERD triggers, recheck 2wk.   Migraines/chronic headaches - her symptoms seem very typical for migraine headaches without aura.  Discussed options for therapy.  She did try 2 different prophylactic medicines for me including Topamax and nortriptyline. However both were low dose and she  did not want to titrate up. She also tried Fioricet, Medrol Dosepak, Phenergan for acute migraine without success.  She has a long history of chronic headaches, and declines to take any other medicine right now.  Thus, at this time we'll refer to headache specialist.

## 2012-12-14 NOTE — Telephone Encounter (Signed)
Refer to Headache specialist that will take her insurance and will work with interpreter services.  Preferably Dr. Victorino Dike or [facility].

## 2012-12-14 NOTE — Telephone Encounter (Signed)
I am working on this. CLS

## 2012-12-14 NOTE — Telephone Encounter (Signed)
Message copied by Janeice Robinson on Tue Dec 14, 2012  4:24 PM ------      Message from: Jac Canavan      Created: Tue Dec 14, 2012  2:28 PM       Daughter Dahm at [phone]. ------

## 2013-01-05 ENCOUNTER — Telehealth: Payer: Self-pay | Admitting: Family Medicine

## 2013-01-05 NOTE — Telephone Encounter (Signed)
Message copied by Janeice Robinson on Wed Jan 05, 2013  1:25 PM ------      Message from: Jac Canavan      Created: Mon Jan 03, 2013  8:06 PM       This is still in my box. Please check your message.  This one needs to be handled ASAP!!! ------

## 2013-01-05 NOTE — Telephone Encounter (Signed)
Eileen Lucas, this patient has an appointment at [facility] on 01/24/13. CLS

## 2013-02-03 ENCOUNTER — Ambulatory Visit: Payer: BC Managed Care – PPO | Admitting: Family Medicine

## 2013-02-04 ENCOUNTER — Ambulatory Visit (INDEPENDENT_AMBULATORY_CARE_PROVIDER_SITE_OTHER): Payer: BC Managed Care – PPO | Admitting: Medical

## 2013-02-04 ENCOUNTER — Encounter: Payer: Self-pay | Admitting: Medical

## 2013-02-04 VITALS — BP 110/80 | HR 68 | Temp 98.3°F | Resp 16 | Wt 149.0 lb

## 2013-02-04 DIAGNOSIS — R079 Chest pain, unspecified: Secondary | ICD-10-CM

## 2013-02-04 DIAGNOSIS — R209 Unspecified disturbances of skin sensation: Secondary | ICD-10-CM

## 2013-02-04 DIAGNOSIS — K219 Gastro-esophageal reflux disease without esophagitis: Secondary | ICD-10-CM

## 2013-02-04 DIAGNOSIS — R200 Anesthesia of skin: Secondary | ICD-10-CM

## 2013-02-04 MED ORDER — OMEPRAZOLE 40 MG PO CPDR: 40.0000 mg | DELAYED_RELEASE_CAPSULE | Freq: Two times a day (BID) | ORAL | Status: DC

## 2013-02-04 NOTE — Progress Notes (Signed)
Subjective: Here for chest pain.  Here with interpreter.  Of note, she has been seen here and the ED for same this year.  She notes pains in center of chest.   Has pain now.   Pain x 60mo.  Has had some stomach pain prior, but hasn't had as much pain since she has been on the omeprazole.  Pain is tight.  Feels a pulling sensation.  She also notes sore throat every morning.  She also gets numbness of the tongue, has difficulty speaking x 2wk.  Denies SOB, no swelling, no paroxysmal nocturnal dyspnea,  No palpitations.  No syncope. No numbness, tingling, weakness.    Past Medical History  Diagnosis Date  . Migraines    Review of Systems Constitutional: -fever, -chills, -sweats, -unexpected weight change,-fatigue ENT: -runny nose, -ear pain, -sore throat Cardiology:   -palpitations, -edema Respiratory: -cough, -shortness of breath, -wheezing Gastroenterology: -abdominal pain, -nausea, -vomiting, -diarrhea, -constipation  Hematology: -bleeding or bruising problems Musculoskeletal: -arthralgias, -myalgias, -joint swelling, -back pain Ophthalmology: -vision changes Urology: -dysuria, -difficulty urinating, -hematuria, -urinary frequency, -urgency Neurology: -headache, -weakness, -tingling   Objective: Filed Vitals:   02/04/13 1142  BP: 110/80  Pulse: 68  Temp: 98.3 F (36.8 C)  Resp: 16    General appearance: alert, no distress, WD/WN HEENT: normocephalic, sclerae anicteric, TMs pearly, nares patent, no discharge or erythema, pharynx normal Oral cavity: MMM, no lesions Neck: supple, no lymphadenopathy, no thyromegaly, no masses Heart: RRR, normal S1, S2, no murmurs Lungs: CTA bilaterally, no wheezes, rhonchi, or rales Chest: tender to palpation of upper chest wall, anterior and posterior Abdomen: +bs, soft, non tender, non distended, no masses, no hepatomegaly, no splenomegaly Pulses: 2+ symmetric, upper and lower extremities, normal cap refill Neuro: right distal lateral tongue with  decreased sense of touch in general, otherwise nonfocal exam    Assessment: Encounter Diagnoses  Name Primary?  . Chest pain Yes  . GERD (gastroesophageal reflux disease)   . Numbness of tongue     Plan: Discussed case with Dr. Susann Givens supervising physician.  She continues to have both chest wall and GERD symptoms that are likely the main causes of her symptoms.  No obvious stroke of neurological findings on exam except the tongue, and not sure this is really the symptoms she is trying to convey.  She does have to use the interpreter.  So there is a chance than some symptoms are described in a way that may not be perceived the way she means.    At this point, we will increase to omeprazole 40mg  BID, avoid GERD triggers, hydrate well, and I advised referral to GI.   Can use Tums prn.  She will check insurance regarding referral.  She recently couldn't proceed with left shoulder surgery due to financial difficulties.  Thus, she may face the same issue with endoscopy/GI referral.  Discussed signs/symptoms of stroke that would prompt 911 call.  Follow-up pending call back

## 2013-03-03 ENCOUNTER — Telehealth: Payer: Self-pay | Admitting: Medical

## 2013-03-03 NOTE — Telephone Encounter (Signed)
Please call concerning endoscopy?? appt

## 2013-03-07 NOTE — Telephone Encounter (Signed)
I spoke with the patients family member about the appointment and I stated that I would mail them a copy of the patients appointment information to them in the mail. CLS

## 2013-03-07 NOTE — Telephone Encounter (Signed)
I tried to call the patient with there appt. But there was no answer and no voicemail. CLS [facility] [phone] 03/15/2013 @ 1045 am to see Dr. Loreta AveMann

## 2013-03-15 ENCOUNTER — Other Ambulatory Visit: Payer: Self-pay | Admitting: Gastroenterology

## 2013-03-15 DIAGNOSIS — R1013 Epigastric pain: Secondary | ICD-10-CM

## 2013-03-24 ENCOUNTER — Ambulatory Visit (HOSPITAL_COMMUNITY)
Admission: RE | Admit: 2013-03-24 | Discharge: 2013-03-24 | Disposition: A | Discharge status: Home / Self Care | Payer: BC Managed Care – PPO | Source: Ambulatory Visit | Attending: Gastroenterology | Admitting: Gastroenterology

## 2013-03-24 ENCOUNTER — Encounter (HOSPITAL_COMMUNITY)
Admission: RE | Admit: 2013-03-24 | Discharge: 2013-03-24 | Disposition: A | Discharge status: Home / Self Care | Payer: BC Managed Care – PPO | Source: Ambulatory Visit | Attending: Gastroenterology | Admitting: Gastroenterology

## 2013-03-24 DIAGNOSIS — N281 Cyst of kidney, acquired: Secondary | ICD-10-CM | POA: Insufficient documentation

## 2013-03-24 DIAGNOSIS — R1013 Epigastric pain: Secondary | ICD-10-CM

## 2013-03-24 DIAGNOSIS — R109 Unspecified abdominal pain: Secondary | ICD-10-CM | POA: Insufficient documentation

## 2013-03-24 MED ORDER — SINCALIDE 5 MCG IJ SOLR
INTRAMUSCULAR | Status: AC
  Administered 2013-03-24: 1.35 ug via INTRAVENOUS
  Filled 2013-03-24: qty 5

## 2013-03-24 MED ORDER — STERILE WATER FOR INJECTION IJ SOLN
INTRAMUSCULAR | Status: AC
  Filled 2013-03-24: qty 10

## 2013-03-24 MED ORDER — SINCALIDE 5 MCG IJ SOLR
0.0200 ug/kg | Freq: Once | INTRAMUSCULAR | Status: AC
  Administered 2013-03-24: 1.35 ug via INTRAVENOUS

## 2013-03-24 MED ORDER — TECHNETIUM TC 99M MEBROFENIN IV KIT
5.0000 | PACK | Freq: Once | INTRAVENOUS | Status: AC | PRN
  Administered 2013-03-24: 5 via INTRAVENOUS

## 2013-05-02 ENCOUNTER — Ambulatory Visit (INDEPENDENT_AMBULATORY_CARE_PROVIDER_SITE_OTHER): Payer: BC Managed Care – PPO | Admitting: Medical

## 2013-05-02 ENCOUNTER — Encounter: Payer: Self-pay | Admitting: Medical

## 2013-05-02 VITALS — BP 120/80 | HR 84 | Temp 97.7°F | Resp 14 | Wt 149.0 lb

## 2013-05-02 DIAGNOSIS — G43909 Migraine, unspecified, not intractable, without status migrainosus: Secondary | ICD-10-CM

## 2013-05-02 MED ORDER — METHYLPREDNISOLONE (PAK) 4 MG PO TABS: ORAL_TABLET | ORAL | Status: DC

## 2013-05-02 MED ORDER — METOPROLOL TARTRATE 25 MG PO TABS: 25.0000 mg | ORAL_TABLET | Freq: Two times a day (BID) | ORAL | Status: DC

## 2013-05-02 MED ORDER — HYDROCODONE-ACETAMINOPHEN 5-325 MG PO TABS: 1.0000 | ORAL_TABLET | Freq: Four times a day (QID) | ORAL | Status: DC | PRN

## 2013-05-02 NOTE — Progress Notes (Signed)
Subjective: Mrs. Dominicaepal is a 53yo [place] female here for recheck on migraines.   Here with her niece who is translating for us today.  Currently has a headache x a whole month.  Headache worse night and morning, but doesn't awake her from sleep.  Tends to tolerate the headache better in the day time.  +photophobia, phonophobia.  This headache has been 4/10.   These headaches have been more in the right side of head, temple to behind ear.  Using nothing for current headache.  She does feel that stress is a big trigger.    I saw her as a new patient in early August 2014 to establish and to discuss her chronic headaches.   At this point we have tried both Topamax as a preventative and nortriptyline as a preventative. From what I gather from the history she was compliant at least for 30 days on each. However given the language barrier, I'm not so sure she understood the treatment to be ongoing preventative daily medications, although I discussed this thoroughly at her 2 previous visits.  She does have history of migraines starting in her teenage years.  She gets migraines ranging from 0-14 days out of the months.   typically gets bad migraine lasting 3 days, and some months can have 3 migraines lasting 3-4 days, causing her to be debilitated up to 14 days per month.   Some months gets no headaches though.   No specific trigger identified.  She does get associated nausea, photophobia, phonophobia, but no visual changes, no aura, no weakness, no numbness, no tingling.  Has been prescribed a variety of medications including narcotic pain medications, NSAIDs, tramadol, Fioricet.  The only preventative medication has been Topamax.  She doesn't recall ever seeing a headache specialist.  She was seen in the ED for headache 2010, had CT scan which was normal.   Currently her method of treatment is to rest in a cool dark room.   ROS as in subjective    Objective:   Physical Exam  Filed Vitals:   05/02/13 1343   BP: 120/80  Pulse: 84  Temp: 97.7 F (36.5 C)  Resp: 14    General appearance: alert, no distress, WD/WN, pleasant female HEENT: normocephalic, sclerae anicteric, PERRLA, EOMi, nares patent, no discharge or erythema, pharynx normal Oral cavity: MMM, upper dental bridge, no other lesions Neck: supple, no lymphadenopathy, no thyromegaly, no masses, no bruits Heart: RRR, normal S1, S2, no murmurs Lungs: CTA bilaterally, no wheezes, rhonchi, or rales Pulses: 2+ symmetric, upper and lower extremities, normal cap refill Neurological: alert, oriented x 3, CN2-12 intact, nonfocal exam   Assessment and Plan :    Encounter Diagnosis  Name Primary?  . Migraine Yes    Migraines - we discussed her symptoms, her current headache, her prior treatments.   We again discussed her history of migraines, prior treatments, preventative versus abortive therapies.  We will use a trial of the metoprolol 25 mg twice daily as a preventative, Excedrin or hydrocodone when necessary for abortive measures, and short-term Medrol Dosepak for the current migraine.  I went over these instructions with her niece who is translating, and she agrees to this plan.  If we don't see improvement with this regimen, we'll likely refer to headache specialist, Dr. Victorino DikeHewitt.  Follow up in 3-4 weeks.

## 2013-05-02 NOTE — Patient Instructions (Addendum)
  Thank you for giving me the opportunity to serve you today.    Your diagnosis today includes: Encounter Diagnosis  Name Primary?  . Migraine Yes     Specific recommendations today include:  Begin a medication called Metoprolol twice daily to help prevent headaches.  Take this every day once in the morning and once in the evening.  When you get a bad headache, you may either use Excedrin Migraine over-the-counter or the prescription hydrocodone pain medication I prescribed  For the next 7 days I want you to use a medication called Medrol Dosepak. This is a steroid to help calm down the current migraine.  I want to see you back in 3-4 weeks to recheck on these medications.  If your current headache does not seem to improve within the next 3-4 days, then let me know    Follow up: in 3- 4 weeks

## 2013-05-23 LAB — HM COLONOSCOPY

## 2013-05-24 ENCOUNTER — Encounter: Payer: Self-pay | Admitting: Internal Medicine

## 2013-05-30 ENCOUNTER — Encounter: Payer: Self-pay | Admitting: Medical

## 2013-09-02 ENCOUNTER — Encounter: Payer: Self-pay | Admitting: Medical

## 2013-09-02 ENCOUNTER — Ambulatory Visit (INDEPENDENT_AMBULATORY_CARE_PROVIDER_SITE_OTHER): Payer: BC Managed Care – PPO | Admitting: Medical

## 2013-09-02 VITALS — BP 112/88 | HR 60 | Temp 98.2°F | Resp 14 | Wt 146.0 lb

## 2013-09-02 DIAGNOSIS — K5900 Constipation, unspecified: Secondary | ICD-10-CM

## 2013-09-02 DIAGNOSIS — R1012 Left upper quadrant pain: Secondary | ICD-10-CM

## 2013-09-02 LAB — COMPREHENSIVE METABOLIC PANEL
ALK PHOS: 74 U/L (ref 39–117)
ALT: 11 U/L (ref 0–35)
AST: 16 U/L (ref 0–37)
Albumin: 4.4 g/dL (ref 3.5–5.2)
BILIRUBIN TOTAL: 0.7 mg/dL (ref 0.2–1.2)
BUN: 7 mg/dL (ref 6–23)
CO2: 29 mEq/L (ref 19–32)
Calcium: 9.8 mg/dL (ref 8.4–10.5)
Chloride: 106 mEq/L (ref 96–112)
Creat: 0.68 mg/dL (ref 0.50–1.10)
GLUCOSE: 86 mg/dL (ref 70–99)
Potassium: 4.7 mEq/L (ref 3.5–5.3)
SODIUM: 141 meq/L (ref 135–145)
Total Protein: 6.7 g/dL (ref 6.0–8.3)

## 2013-09-02 LAB — CBC WITH DIFFERENTIAL/PLATELET
Basophils Absolute: 0 10*3/uL (ref 0.0–0.1)
Basophils Relative: 0 % (ref 0–1)
EOS ABS: 0.1 10*3/uL (ref 0.0–0.7)
EOS PCT: 1 % (ref 0–5)
HCT: 39.6 % (ref 36.0–46.0)
HEMOGLOBIN: 13.3 g/dL (ref 12.0–15.0)
LYMPHS ABS: 2.2 10*3/uL (ref 0.7–4.0)
Lymphocytes Relative: 35 % (ref 12–46)
MCH: 29.9 pg (ref 26.0–34.0)
MCHC: 33.6 g/dL (ref 30.0–36.0)
MCV: 89 fL (ref 78.0–100.0)
MONO ABS: 0.4 10*3/uL (ref 0.1–1.0)
MONOS PCT: 6 % (ref 3–12)
Neutro Abs: 3.7 10*3/uL (ref 1.7–7.7)
Neutrophils Relative %: 58 % (ref 43–77)
Platelets: 279 10*3/uL (ref 150–400)
RBC: 4.45 MIL/uL (ref 3.87–5.11)
RDW: 14.3 % (ref 11.5–15.5)
WBC: 6.4 10*3/uL (ref 4.0–10.5)

## 2013-09-02 LAB — POCT URINALYSIS DIPSTICK
Bilirubin, UA: NEGATIVE
GLUCOSE UA: NEGATIVE
Ketones, UA: NEGATIVE
Leukocytes, UA: NEGATIVE
NITRITE UA: NEGATIVE
PROTEIN UA: NEGATIVE
RBC UA: NEGATIVE
UROBILINOGEN UA: NEGATIVE
pH, UA: 8

## 2013-09-02 LAB — LIPASE: LIPASE: 15 U/L (ref 0–75)

## 2013-09-02 MED ORDER — POLYETHYLENE GLYCOL 3350 17 GM/SCOOP PO POWD: 17.0000 g | Freq: Every day | ORAL | Status: DC

## 2013-09-02 NOTE — Patient Instructions (Signed)
Recommendations: You have left upper quadrant abdominal pain and symptoms of constipation  Over the weekend, try and just drink clear fluids only, or clear fluids + small bland food portions  You may use Ibuprofen or Tylenol OTC for pain.  If I see anything major with the labs over the weekend I will call.  Drink at least 64 oz of water daily  Consider beginning Miralax daily for constipation.    Acute Pancreatitis Acute pancreatitis is a disease in which the pancreas becomes suddenly inflamed. The pancreas is a large gland located behind your stomach. The pancreas produces enzymes that help digest food. The pancreas also releases the hormones glucagon and insulin that help regulate blood sugar. Damage to the pancreas occurs when the digestive enzymes from the pancreas are activated and begin attacking the pancreas before being released into the intestine. Most acute attacks last a couple of days and can cause serious complications. Some people become dehydrated and develop low blood pressure. In severe cases, bleeding into the pancreas can lead to shock and can be life-threatening. The lungs, heart, and kidneys may fail. CAUSES  Pancreatitis can happen to anyone. In some cases, the cause is unknown. Most cases are caused by:  Alcohol abuse.  Gallstones. Other less common causes are:  Certain medicines.  Exposure to certain chemicals.  Infection.  Damage caused by an accident (trauma).  Abdominal surgery. SYMPTOMS   Pain in the upper abdomen that may radiate to the back.  Tenderness and swelling of the abdomen.  Nausea and vomiting. DIAGNOSIS  Your caregiver will perform a physical exam. Blood and stool tests may be done to confirm the diagnosis. Imaging tests may also be done, such as X-rays, CT scans, or an ultrasound of the abdomen. TREATMENT  Treatment usually requires a stay in the hospital. Treatment may include:  Pain medicine.  Fluid replacement through an  intravenous line (IV).  Placing a tube in the stomach to remove stomach contents and control vomiting.  Not eating for 3 or 4 days. This gives your pancreas a rest, because enzymes are not being produced that can cause further damage.  Antibiotic medicines if your condition is caused by an infection.  Surgery of the pancreas or gallbladder. HOME CARE INSTRUCTIONS   Follow the diet advised by your caregiver. This may involve avoiding alcohol and decreasing the amount of fat in your diet.  Eat smaller, more frequent meals. This reduces the amount of digestive juices the pancreas produces.  Drink enough fluids to keep your urine clear or pale yellow.  Only take over-the-counter or prescription medicines as directed by your caregiver.  Avoid drinking alcohol if it caused your condition.  Do not smoke.  Get plenty of rest.  Check your blood sugar at home as directed by your caregiver.  Keep all follow-up appointments as directed by your caregiver. SEEK MEDICAL CARE IF:   You do not recover as quickly as expected.  You develop new or worsening symptoms.  You have persistent pain, weakness, or nausea.  You recover and then have another episode of pain. SEEK IMMEDIATE MEDICAL CARE IF:   You are unable to eat or keep fluids down.  Your pain becomes severe.  You have a fever or persistent symptoms for more than 2 to 3 days.  You have a fever and your symptoms suddenly get worse.  Your skin or the white part of your eyes turn yellow (jaundice).  You develop vomiting.  You feel dizzy, or you faint.  Your blood sugar is high (over 300 mg/dL). MAKE SURE YOU:   Understand these instructions.  Will watch your condition.  Will get help right away if you are not doing well or get worse. Document Released: 02/10/2005 Document Revised: 08/12/2011 Document Reviewed: 05/22/2011 [facility] Patient Information 2015 [employer] [place]. This information is not intended to replace  advice given to you by your health care provider. Make sure you discuss any questions you have with your health care provider.

## 2013-09-02 NOTE — Progress Notes (Signed)
Subjective: Here for c/o LUQ pain and headache x 5 days.  Her son is with her who translates today. Interpreter is also here but he is sitting outside the room.  Pain is worse with eating.  Pain has been intermittient.  No nauesea, no vomiting.  No loose stool.  +constiaptoin feeling.  In general has about 3 bowel movements per week, her last bowel movement was yesterday normal seeming. She does note history of constipation on and off.  Currently she does have to strain a lot for bowel movement, sometimes bloated, sometimes gassy. She denies eating lots of fried or spicy foods recently.  No fever. No back pain. No blood in the urine or stool. No urinary symptoms. No other aggravating or relieving factors. No other complaint.  Review of systems as in subjective  Objective:  Filed Vitals:   09/02/13 1338  BP: 112/88  Pulse: 60  Temp: 98.2 F (36.8 C)  Resp: 14    General appearence: alert, no distress, WD/WN  Heart: RRR, normal S1, S2, no murmurs Lungs: CTA bilaterally, no wheezes, rhonchi, or rales Abdomen: +bs, soft, +LUQ tenderness, othewrise non tender, non distended, no masses, no hepatomegaly, no splenomegaly Pulses: 2+ symmetric, upper and lower extremities, normal cap refill Ext: no edema Back: +CVA tenderness on the left  Assessmetn: Encounter Diagnoses  Name Primary?  . Abdominal pain, left upper quadrant Yes  . Unspecified constipation     Plan: Etiology unclear.  Of note in December she came in for right upper quadrant pain but subsequently had normal HIDA scan and ultrasound showing no liver or gallbladder issues.  I suspect this is more related to constipation but there is a possibility of pancreatitis. We will check labs today.  Urinalysis unremarkable today.  Advise she begin MiraLAX to help her constipation, increase water intake, continue to get good fiber intake, avoid spicy or acidic or fatty food. Advise clear fluids in small bland portions of the next 1-2 days in  the event of pancreatitis. We will call lab results. Of note she did have a colonoscopy in the last few months with Dr. Loreta AveMann

## 2013-09-03 LAB — LIPID PANEL
CHOL/HDL RATIO: 3.1 ratio
CHOLESTEROL: 163 mg/dL (ref 0–200)
HDL: 53 mg/dL (ref >39)
LDL Cholesterol: 90 mg/dL (ref 0–99)
TRIGLYCERIDES: 100 mg/dL (ref <150)
VLDL: 20 mg/dL (ref 0–40)

## 2013-09-06 ENCOUNTER — Telehealth: Payer: Self-pay | Admitting: Medical

## 2013-09-06 NOTE — Telephone Encounter (Signed)
I still don't see where results were called.  This was to be done yesterday.  She needs to be aware of results and see how she did over the weekend with Miralax.  Did you get the message sent by [place] yesterday?

## 2013-09-06 NOTE — Telephone Encounter (Signed)
Yes i got the messages and was called twice yesterday, i called again this morning and left another message on both numbers regarding results and for pt TCB.

## 2013-10-26 ENCOUNTER — Ambulatory Visit (INDEPENDENT_AMBULATORY_CARE_PROVIDER_SITE_OTHER): Payer: BC Managed Care – PPO | Admitting: Medical

## 2013-10-26 ENCOUNTER — Encounter: Payer: Self-pay | Admitting: Medical

## 2013-10-26 VITALS — BP 100/60 | HR 82 | Temp 98.2°F | Resp 14 | Wt 144.0 lb

## 2013-10-26 DIAGNOSIS — M549 Dorsalgia, unspecified: Secondary | ICD-10-CM

## 2013-10-26 DIAGNOSIS — M5380 Other specified dorsopathies, site unspecified: Secondary | ICD-10-CM

## 2013-10-26 DIAGNOSIS — G43709 Chronic migraine without aura, not intractable, without status migrainosus: Secondary | ICD-10-CM

## 2013-10-26 DIAGNOSIS — M62830 Muscle spasm of back: Secondary | ICD-10-CM

## 2013-10-26 DIAGNOSIS — M542 Cervicalgia: Secondary | ICD-10-CM

## 2013-10-26 MED ORDER — CYCLOBENZAPRINE HCL 5 MG PO TABS: ORAL_TABLET | ORAL | Status: DC

## 2013-10-26 MED ORDER — IBUPROFEN 600 MG PO TABS: 600.0000 mg | ORAL_TABLET | Freq: Three times a day (TID) | ORAL | Status: DC | PRN

## 2013-10-26 MED ORDER — METOPROLOL TARTRATE 25 MG PO TABS: 25.0000 mg | ORAL_TABLET | Freq: Two times a day (BID) | ORAL | Status: AC

## 2013-10-26 NOTE — Progress Notes (Signed)
Subjective: Here today for f/u.  Using interpreter phone line today in my personal office.  Her main c/o today is right neck and upper back and right arm pain.  Yesterday when took hand up over her head, it hurt shoulder blade.  Has pain all over right neck, upper back, right shoulder, chest.  She was in bathroom, in shower, and when she lifted cup of water up over head, had sudden pain of shoulder blade.  Since then has had pain in right shoulder.   Has neck pain.  Pain in back of head.  Using nothing for this.   No prior similar, although she does have prior chronic pain in left neck/back and arm.  Denies numbness, tingling, weakness.  Denies fall, trauma.   No other aggravating or relieving factor.  Migraines/chronic headache - continues on metoprolol, actually ran out a few weeks ago.  This seems to work really well for her only 2 headaches noted in the last month.  Denies vision changes, hearing changes, no other complaint  Review of Systems Constitutional: -fever, -chills, -sweats, -unexpected weight change,-fatigue ENT: -runny nose, -ear pain, -sore throat Cardiology:  -chest pain, -palpitations, -edema Respiratory: -cough, -shortness of breath, -wheezing Gastroenterology: -abdominal pain, -nausea, -vomiting, -diarrhea, -constipation Hematology: -bleeding or bruising problems Ophthalmology: -vision changes Urology: -dysuria, -difficulty urinating, -hematuria, -urinary frequency, -urgency Neurology:  -weakness, -tingling, -numbness    Objective: Filed Vitals:   10/26/13 1436  BP: 100/60  Pulse: 82  Temp: 98.2 F (36.8 C)  Resp: 14    General appearance: alert, no distress, WD/WN  Neck: tender posterolateral right neck, otherwise supple, no lymphadenopathy, no thyromegaly, no masses, normal ROM Back: bilat upper back paraspinal muscles tender, otherwise nontender Musculoskeletal: mild pain with right arm ROM, but no palpable tenderness, no laxity or grinding, negative special  tests, right shoulder ROM full, otherwise arms nontender, no swelling, no obvious deformity Extremities: no edema, no cyanosis, no clubbing Pulses: 2+ symmetric, upper and lower extremities, normal cap refill Neurological: alert, oriented x 3, CN2-12 intact, strength normal upper extremities and lower extremities, sensation normal throughout, DTRs 2+ throughout, no cerebellar signs, gait normal Psychiatric: normal affect, behavior normal, pleasant    Assessment: Encounter Diagnoses  Name Primary?  . Neck pain Yes  . Upper back pain   . Muscle spasm of back   . Chronic migraine without aura without status migrainosus, not intractable     Plan: Neck pain, upper back pain, spasm - likely has some degree of arthritis in her neck but the acute symptoms suggest muscle spasm.  Advise heat, stretching, begin ibuprofen when necessary, Flexeril each bedtime when necessary.  If not improving in the short term let me know if not much improved in 2 weeks consider neck x-ray and upper back x-ray  Chronic migraine-doing well on metoprolol for preventative

## 2013-11-25 ENCOUNTER — Encounter: Payer: Self-pay | Admitting: Medical

## 2013-11-25 ENCOUNTER — Ambulatory Visit (INDEPENDENT_AMBULATORY_CARE_PROVIDER_SITE_OTHER): Payer: BC Managed Care – PPO | Admitting: Medical

## 2013-11-25 VITALS — BP 112/80 | HR 78 | Temp 97.9°F | Resp 14 | Wt 146.0 lb

## 2013-11-25 DIAGNOSIS — Z23 Encounter for immunization: Secondary | ICD-10-CM

## 2013-11-25 DIAGNOSIS — M2540 Effusion, unspecified joint: Secondary | ICD-10-CM

## 2013-11-25 DIAGNOSIS — M2550 Pain in unspecified joint: Secondary | ICD-10-CM

## 2013-11-25 DIAGNOSIS — I889 Nonspecific lymphadenitis, unspecified: Secondary | ICD-10-CM

## 2013-11-25 DIAGNOSIS — M79646 Pain in unspecified finger(s): Secondary | ICD-10-CM

## 2013-11-25 DIAGNOSIS — K219 Gastro-esophageal reflux disease without esophagitis: Secondary | ICD-10-CM

## 2013-11-25 DIAGNOSIS — R070 Pain in throat: Secondary | ICD-10-CM

## 2013-11-25 MED ORDER — OMEPRAZOLE 40 MG PO CPDR: 40.0000 mg | DELAYED_RELEASE_CAPSULE | Freq: Two times a day (BID) | ORAL | Status: AC

## 2013-11-25 MED ORDER — AMOXICILLIN-POT CLAVULANATE 875-125 MG PO TABS: 1.0000 | ORAL_TABLET | Freq: Two times a day (BID) | ORAL | Status: DC

## 2013-11-25 MED ORDER — IBUPROFEN 600 MG PO TABS: 600.0000 mg | ORAL_TABLET | Freq: Three times a day (TID) | ORAL | Status: DC | PRN

## 2013-11-25 NOTE — Progress Notes (Signed)
Subjective:   Eileen Lucas is a 53 y.o. female presenting on 11/25/2013 with knot under her neck, fever  Here with interpreter.   She notes tender knot under chin/in neck region and some sore throat pain x 2 weeks, no worse or better.   No nasal congestion, no cough, no ear pain, no rash, no sneezing.  Using nothing for this.  Felt feverish some this past few days, but no documented fever.   No sick contacts.  She reports the last 2 weeks with pains in hands, finger joints, right elbow pain, knee pains.  Gets some joint stiffness in the mornings for an hour or more.  No bony knots. She does get some swelling occasional in her right hand but no other joint swelling.  No family hx/o RA  No trauma, fall or injury.   No other aggravating or relieving factors.  No other complaint.  Review of Systems ROS as in subjective      Objective:    Filed Vitals:   11/25/13 1018  BP: 112/80  Pulse: 78  Temp: 97.9 F (36.6 C)  Resp: 14    General appearance: alert, no distress, WD/WN HEENT: normocephalic, sclerae anicteric, TMs pearly, nares patent, no discharge or erythema, pharynx with mild erythema Oral cavity: MMM, no lesions Neck: supple, right submandibular region anteriorly with 5mm diameter mobile tender lymph nodes, otherwise shoddy anterior nodes, no thyromegaly, no masses Heart: RRR, normal S1, S2, no murmurs Lungs: CTA bilaterally, no wheezes, rhonchi, or rales Neuro: UE WNL MSK: right handed, Tender along right dorsal hand, mild tenderness of fingers bilat, no obvious swelling or deformity, +squeeze test, no wrist tenderness, mild tenderness right medial epicondyle, knees nontender, no laxity or deformity, rest of MSK UE and LE unremarkable Pulses: 2+ symmetric, upper and lower extremities, normal cap refill      Assessment: Encounter Diagnoses  Name Primary?  . Polyarthralgia Yes  . Joint swelling   . Pain of finger, unspecified laterality   . Lymphadenitis   . Throat  pain   . Gastroesophageal reflux disease without esophagitis   . Needs flu shot      Plan: Polyarthralgia, joint swelling, finger pain - discussed symptoms and signs, differential.   Offered xrays and labs to help rule out RA, but she declines for now.   Will pursue if not improving.   Begin Ibuprofen for pain and inflammation.  F/u 36mo  Lymphadenitis, throat pain - likely viral URI.   Begin ibuprofen, supportive care for URI as discussed.  If tongue, floor of mouth swelling, fever, rash, then begin Augmentin.  No sign of Ludwig's angina today, but discussed symptoms and treatment and followup if this occurs.  GERD - restart back on omeprazole for flare up, recurrence, f/u 36mo  Counseled on the influenza virus vaccine.  Vaccine information sheet given.  Influenza vaccine given after consent obtained.   Eileen Lucas was seen today for knot under her neck, fever.  Diagnoses and associated orders for this visit:  Polyarthralgia  Joint swelling  Pain of finger, unspecified laterality  Lymphadenitis  Throat pain  Gastroesophageal reflux disease without esophagitis  Needs flu shot - Flu Vaccine QUAD 36+ mos PF IM (Fluarix Quad PF)  Other Orders - ibuprofen (ADVIL,MOTRIN) 600 MG tablet; Take 1 tablet (600 mg total) by mouth every 8 (eight) hours as needed. - omeprazole (PRILOSEC) 40 MG capsule; Take 1 capsule (40 mg total) by mouth 2 (two) times daily. - amoxicillin-clavulanate (AUGMENTIN) 875-125 MG per tablet; Take  1 tablet by mouth 2 (two) times daily.    Return in about 1 month (around 12/26/2013).

## 2013-12-14 ENCOUNTER — Ambulatory Visit: Payer: BC Managed Care – PPO | Admitting: Medical

## 2014-01-18 ENCOUNTER — Encounter: Payer: Self-pay | Admitting: Medical

## 2014-01-18 ENCOUNTER — Telehealth: Payer: Self-pay | Admitting: Medical

## 2014-01-18 ENCOUNTER — Ambulatory Visit (INDEPENDENT_AMBULATORY_CARE_PROVIDER_SITE_OTHER): Payer: BC Managed Care – PPO | Admitting: Medical

## 2014-01-18 VITALS — BP 112/82 | HR 74 | Temp 97.6°F | Resp 16 | Wt 146.0 lb

## 2014-01-18 DIAGNOSIS — M2550 Pain in unspecified joint: Secondary | ICD-10-CM

## 2014-01-18 DIAGNOSIS — R0683 Snoring: Secondary | ICD-10-CM

## 2014-01-18 DIAGNOSIS — G478 Other sleep disorders: Secondary | ICD-10-CM

## 2014-01-18 DIAGNOSIS — R5381 Other malaise: Secondary | ICD-10-CM

## 2014-01-18 DIAGNOSIS — G43009 Migraine without aura, not intractable, without status migrainosus: Secondary | ICD-10-CM

## 2014-01-18 DIAGNOSIS — M7989 Other specified soft tissue disorders: Secondary | ICD-10-CM

## 2014-01-18 DIAGNOSIS — M79646 Pain in unspecified finger(s): Secondary | ICD-10-CM

## 2014-01-18 DIAGNOSIS — R5383 Other fatigue: Secondary | ICD-10-CM

## 2014-01-18 DIAGNOSIS — R0600 Dyspnea, unspecified: Secondary | ICD-10-CM

## 2014-01-18 MED ORDER — PREDNISONE 20 MG PO TABS: ORAL_TABLET | ORAL | Status: DC

## 2014-01-18 NOTE — Telephone Encounter (Signed)
Please set up for sleep study

## 2014-01-18 NOTE — Progress Notes (Addendum)
Subjective: Here today with translator Bishnu Paudel, from [employer]  Mainly has 2 complaints today headaches and joint pains  She has a history of migraines, have been seeing her for several months for this.  For the last 4 months metoprolol twice a day seems to help cut down the overall frequency of migraines. However she is here for a 2 day history of migraine, trigger unknown.  He has no changes in diet, eats healthy, in general never feels well  She reports ongoing pains and swelling in her hands wrist and joint pains in the knees and hips as well.   denies injury trauma or fall.  Gets morning stiffness.  We discussed the same symptom at her recent last visit.   ROS as in subjective  Objective: General appearance: alert, no distress, WD/WN HEENT: normocephalic, sclerae anicteric, TMs pearly, nares patent, no discharge or erythema, pharynx with mild erythema Oral cavity: MMM, no lesions Neck: supple, no lymphadenopathy,  no thyromegaly, no masses Heart: RRR, normal S1, S2, no murmurs Lungs: CTA bilaterally, no wheezes, rhonchi, or rales Neuro: UE WNL MSK: right handed, Tender along right dorsal hand, mild tenderness of fingers bilat, no obvious swelling or deformity, +squeeze test, no wrist tenderness, mild tenderness right medial epicondyle, knees nontender, no laxity or deformity, rest of MSK UE and LE unremarkable Pulses: 2+ symmetric, upper and lower extremities, normal cap refill    Assessment:  Encounter Diagnoses  Name Primary?  Marland Kitchen. Nonintractable migraine, unspecified migraine type Yes  . Pain of finger, unspecified laterality   . Finger swelling   . Polyarthralgia   . Malaise and fatigue   . Non-restorative sleep   . Snoring   . PND (paroxysmal nocturnal dyspnea)    Plan: migraines-continue metoprolol for preventative therapy, begin 5 day short course of Prednisone for current migraine and joint pains.  Vision and hearing exam today.  Consider sleep apnea as  potential cause  Given her headaches, intermittent PND, malaise, nonrestorative sleep, snoring-will refer for sleep study. Epworth sleep scale score of 5.  Neck circumference is 13.5".  Finger pain, joint swelling, polyarthralgia-begin 5 day course of prednisone.   Rheumatoid labs today for screening.

## 2014-01-19 LAB — SEDIMENTATION RATE: Sed Rate: 8 mm/hr (ref 0–22)

## 2014-01-19 LAB — TSH: TSH: 2.395 u[IU]/mL (ref 0.350–4.500)

## 2014-01-20 LAB — ANA: ANA: NEGATIVE

## 2014-01-20 LAB — CYCLIC CITRUL PEPTIDE ANTIBODY, IGG

## 2014-01-23 ENCOUNTER — Other Ambulatory Visit: Payer: Self-pay | Admitting: Family Medicine

## 2014-01-23 DIAGNOSIS — G4720 Circadian rhythm sleep disorder, unspecified type: Secondary | ICD-10-CM

## 2014-01-23 DIAGNOSIS — R0683 Snoring: Secondary | ICD-10-CM

## 2014-01-23 NOTE — Telephone Encounter (Signed)
I fax her sleep study to [facility] for home sleep study to be done Fax number [phone]

## 2014-01-23 NOTE — Telephone Encounter (Signed)
Actually, may be just as easy to do the in home if insurance will cover. February is a long ways out

## 2014-01-23 NOTE — Telephone Encounter (Signed)
I called over to [facility] to schedule her sleep study and they are scheduling into February. Is this okay? It would be better to schedule at the hospital so she can have a interpreter there.

## 2014-01-24 ENCOUNTER — Encounter: Payer: Self-pay | Admitting: Family Medicine

## 2014-01-27 ENCOUNTER — Ambulatory Visit
Admission: RE | Admit: 2014-01-27 | Discharge: 2014-01-27 | Disposition: A | Discharge status: Home / Self Care | Payer: BC Managed Care – PPO | Source: Ambulatory Visit | Attending: Medical | Admitting: Medical

## 2014-01-27 ENCOUNTER — Encounter: Payer: Self-pay | Admitting: Medical

## 2014-01-27 ENCOUNTER — Ambulatory Visit (INDEPENDENT_AMBULATORY_CARE_PROVIDER_SITE_OTHER): Payer: BC Managed Care – PPO | Admitting: Medical

## 2014-01-27 VITALS — BP 100/78 | HR 98 | Temp 97.9°F | Resp 16 | Wt 148.0 lb

## 2014-01-27 DIAGNOSIS — M545 Low back pain, unspecified: Secondary | ICD-10-CM

## 2014-01-27 DIAGNOSIS — M549 Dorsalgia, unspecified: Secondary | ICD-10-CM

## 2014-01-27 DIAGNOSIS — M79643 Pain in unspecified hand: Secondary | ICD-10-CM

## 2014-01-27 DIAGNOSIS — M546 Pain in thoracic spine: Secondary | ICD-10-CM

## 2014-01-27 MED ORDER — CYCLOBENZAPRINE HCL 10 MG PO TABS: ORAL_TABLET | ORAL | Status: DC

## 2014-01-27 NOTE — Progress Notes (Signed)
   Subjective:   Eileen Lucas is a 53 y.o. female presenting on 01/27/2014 with Back Pain  Here with interpreter from [employer].  Here for back pain.   She notes long hx/o back pain for years.  No specific prior fall or trauma.   Has had pains in back, upper, mid and lower intermittent for years.  Using nothing currently for pain, but when we saw her recently for hand pains and migraines ,the prednisone medication worked well.  Denies red flag symptoms.  No other aggravating or relieving factors.  No other complaint.  Review of Systems ROS as in subjective      Objective:    Filed Vitals:   01/27/14 1418  BP: 100/78  Pulse: 98  Temp: 97.9 F (36.6 C)  Resp: 16    General appearance: alert, no distress, WD/WN Oral cavity: MMM, no lesions Neck: supple, no lymphadenopathy, no thyromegaly, no masses Heart: RRR, normal S1, S2, no murmurs Lungs: CTA bilaterally, no wheezes, rhonchi, or rales Abdomen: +bs, soft, non tender, non distended, no masses, no hepatomegaly, no splenomegaly Pulses: 2+ symmetric, upper and lower extremities, normal cap refill Back: tender paraspinal muscles throughout, mild pain with ROM which is about 80% of normal Ext: no edema Neuro: nonfocal exam      Assessment: Encounter Diagnoses  Name Primary?  . Bilateral low back pain without sciatica Yes  . Upper back pain   . Pain of hand, unspecified laterality      Plan: Will send for spine and hand xrays.  Begin flexeril 1-2 times daily prn spasm/pain, OTC NSAID prn pain, discussed and demonstrated daily stretching routine to help with back pains, advised massage, working on improving sleep.  From her recent visit last week regarding hands, go for xrays.  Reviewed the recent labs.   Eileen Lucas was seen today for back pain.  Diagnoses and associated orders for this visit:  Bilateral low back pain without sciatica - DG Thoracolumbar Spine; Future - DG Hand Complete Right; Future - DG Hand  Complete Left; Future  Upper back pain - DG Thoracolumbar Spine; Future - DG Hand Complete Right; Future - DG Hand Complete Left; Future  Pain of hand, unspecified laterality - DG Thoracolumbar Spine; Future - DG Hand Complete Right; Future - DG Hand Complete Left; Future  Other Orders - cyclobenzaprine (FLEXERIL) 10 MG tablet; 1/2-1 tablet po once to twice daily for spasm and back pain; caution sedation     Return pending xrays.

## 2014-01-30 ENCOUNTER — Encounter: Payer: Self-pay | Admitting: Family Medicine

## 2014-04-25 ENCOUNTER — Ambulatory Visit (HOSPITAL_BASED_OUTPATIENT_CLINIC_OR_DEPARTMENT_OTHER): Payer: 59 | Attending: Medical

## 2014-05-04 ENCOUNTER — Telehealth: Payer: Self-pay | Admitting: Medical

## 2014-05-04 ENCOUNTER — Other Ambulatory Visit: Payer: Self-pay

## 2014-05-04 ENCOUNTER — Telehealth: Payer: Self-pay

## 2014-05-04 ENCOUNTER — Ambulatory Visit (INDEPENDENT_AMBULATORY_CARE_PROVIDER_SITE_OTHER): Payer: 59 | Admitting: Medical

## 2014-05-04 ENCOUNTER — Encounter: Payer: Self-pay | Admitting: Medical

## 2014-05-04 VITALS — BP 124/88 | HR 76 | Temp 98.2°F | Resp 15 | Wt 148.0 lb

## 2014-05-04 DIAGNOSIS — M546 Pain in thoracic spine: Secondary | ICD-10-CM | POA: Diagnosis not present

## 2014-05-04 DIAGNOSIS — M792 Neuralgia and neuritis, unspecified: Secondary | ICD-10-CM

## 2014-05-04 DIAGNOSIS — R202 Paresthesia of skin: Secondary | ICD-10-CM | POA: Diagnosis not present

## 2014-05-04 DIAGNOSIS — M542 Cervicalgia: Secondary | ICD-10-CM

## 2014-05-04 DIAGNOSIS — M79602 Pain in left arm: Secondary | ICD-10-CM

## 2014-05-04 DIAGNOSIS — M549 Dorsalgia, unspecified: Secondary | ICD-10-CM

## 2014-05-04 MED ORDER — IBUPROFEN 600 MG PO TABS: 600.0000 mg | ORAL_TABLET | Freq: Three times a day (TID) | ORAL | Status: DC | PRN

## 2014-05-04 MED ORDER — CAPSAICIN 0.035 % EX CREA: 1.0000 "application " | TOPICAL_CREAM | Freq: Two times a day (BID) | CUTANEOUS | Status: AC

## 2014-05-04 MED ORDER — CYCLOBENZAPRINE HCL 10 MG PO TABS: ORAL_TABLET | ORAL | Status: DC

## 2014-05-04 NOTE — Telephone Encounter (Signed)
Refer to [facility], but I'll need to add some comments.  She will also need [employer] for interpreter service, so make them aware.

## 2014-05-04 NOTE — Telephone Encounter (Signed)
Done

## 2014-05-04 NOTE — Telephone Encounter (Signed)
Ordered through [facility], advised daughter they would be calling.

## 2014-05-04 NOTE — Telephone Encounter (Signed)
Spoke with daughter and told her [facility] would be calling her with an appointment for her mom. Advised her if she doesn't hear from them by next week to call us so we can follow up.

## 2014-05-04 NOTE — Progress Notes (Signed)
Subjective: Here with Eileen LeakPrasamsa Lucas, interpreter from [employer]  Having some left sided neck, behind ear, and arm and finger pains, left shoulder blade pain.  Does get numbness in left hand fingertips and tingling.  X 15 days, but i have seen her for this before.   She has hx/o low back pains too.  Lately just staying in pain up to 7/10 with left neck and arm and upper back.  No recent fall, trauma, surgery.  Currently has nothing to take for this pain.  No other aggravating or relieving factors. No other complaint.  ROS as in subjective  Objective: BP 124/88 mmHg  Pulse 76  Temp(Src) 98.2 F (36.8 C) (Oral)  Resp 15  Wt 148 lb (67.132 kg)  LMP 04/27/2012  Gen: wd, wn, nad Skin: unremarkable Neck: left neck tender, neck ROM reduced in general due to pain particular with extension, left rotation, lateral flexion bilat, no mass, no lymphadenopathy HENT unremarkable MSK: tender throughout left upper arm, otherwise nontender, pain with shoulder ROM, but pain seems to originate in the neck and upper back, not the shoulder particularly, tender over left scapula Back: tender left upper paraspinal region Ext: no edema UE pulses normal Neuro: mildly decreased strength of left wrist and fingers compared to right, but still 5/5, DTRs and sensation normal, otherwise neuro intact UE   Assessment: Encounter Diagnoses  Name Primary?  . Radicular pain in left arm   . Neck pain on left side Yes  . Upper back pain on left side   . Paresthesia    Plan: Begin Ibuprofen, Flexeril prn, capsaicin cream, referral to PT.   Suspect bulging disc in C spine vs generalized muscle spasm and inflammation of left upper back, neck and shoulder girdle.   Discussed possibly of needed C spine MRI if pain continues the next few months.

## 2014-05-09 NOTE — Telephone Encounter (Signed)
pls check status of referral

## 2014-05-12 NOTE — Telephone Encounter (Signed)
I left a message for [facility]. To cb. CLS [phone]

## 2014-05-15 NOTE — Telephone Encounter (Signed)
Patients daughter is aware of the patients appointment for physical therapy on 05/22/14 @ 345 pm at  PT [address] [place], [place] [phone]

## 2014-05-22 ENCOUNTER — Ambulatory Visit: Payer: 59 | Attending: Medical | Admitting: Physical Therapy

## 2014-06-28 ENCOUNTER — Encounter: Payer: Self-pay | Admitting: Medical

## 2014-06-28 ENCOUNTER — Ambulatory Visit (INDEPENDENT_AMBULATORY_CARE_PROVIDER_SITE_OTHER): Payer: 59 | Admitting: Medical

## 2014-06-28 VITALS — BP 120/80 | HR 79 | Temp 98.1°F | Resp 15 | Wt 148.0 lb

## 2014-06-28 DIAGNOSIS — M549 Dorsalgia, unspecified: Secondary | ICD-10-CM

## 2014-06-28 DIAGNOSIS — M5410 Radiculopathy, site unspecified: Secondary | ICD-10-CM | POA: Diagnosis not present

## 2014-06-28 DIAGNOSIS — G8929 Other chronic pain: Secondary | ICD-10-CM

## 2014-06-28 MED ORDER — GABAPENTIN 100 MG PO CAPS
ORAL_CAPSULE | ORAL | Status: AC
Start: 2014-06-28 — End: ?

## 2014-06-28 MED ORDER — METHYLPREDNISOLONE 4 MG PO TABS: ORAL_TABLET | ORAL | Status: AC

## 2014-06-28 NOTE — Progress Notes (Signed)
Subjective: Chief Complaint  Patient presents with  . Leg Pain    right   Here for leg pain.  Here with interpreter Irine SealBadal Gurung from [employer].  Having right leg pain including from buttock to foot.  Pain is sharp, posterolateral.  Pain is constant.   Pain is usually 6/10.  Denies tingling, no numbness, no weakness, but the pain is a burning sensation.  Since last visit no fall or injury.  She has already completed the Ibuprofen and flexeril from last visit.  At last visit in March, [facility] due to chronic back pain with radicular symptoms.  She notes that due to cost and concern over bills, she has not went.  No other aggravating or relieving factors. No other complaint.  ROS as in subjective  Past Medical History  Diagnosis Date  . Migraines    History reviewed. No pertinent past surgical history.   Objective: BP 120/80 mmHg  Pulse 79  Temp(Src) 98.1 F (36.7 C) (Oral)  Resp 15  Wt 148 lb (67.132 kg)  LMP 04/27/2012  Gen: wd, wn, nad Skin: unremarkable MSK: tender along right lateral leg, buttock and lateral calve and anterior ankle, no swelling, no deformity, normal hip ROM Back: nontender, mild pain with flexion and extension which is relatively full Ext: no edema Legs neurovascularly intact   Assessment: Encounter Diagnoses  Name Primary?  . Chronic back pain Yes  . Radicular syndrome of right leg     Plan: Begin Gabapentin 100mg  in the morning and 200mg  QHS, short term medrol dosepak.  F/u 1-7678mo. She declines PT or ortho referral at this time.   reviewed recent xray.

## 2014-07-14 ENCOUNTER — Telehealth: Payer: Self-pay | Admitting: Medical

## 2014-07-14 NOTE — Telephone Encounter (Signed)
Pt. Picked up medical records on Jul 13, 2014

## 2014-11-01 ENCOUNTER — Encounter: Admit: 2014-11-01

## 2014-11-01 ENCOUNTER — Inpatient Hospital Stay
Admit: 2014-11-01 | Discharge: 2014-11-01 | Disposition: A | Discharge status: Home / Self Care | Payer: MEDICAID | Attending: Emergency Medicine

## 2014-11-01 DIAGNOSIS — R0789 Other chest pain: Secondary | ICD-10-CM

## 2014-11-01 LAB — CBC WITH AUTO DIFFERENTIAL
Basophils %: 0.8 %
Basophils Absolute: 0 10*3/uL (ref 0.0–0.2)
Eosinophils %: 1.4 %
Eosinophils Absolute: 0.1 10*3/uL (ref 0.0–0.6)
Hematocrit: 41.7 % (ref 36.0–48.0)
Hemoglobin: 14.2 g/dL (ref 12.0–16.0)
Lymphocytes %: 33.4 %
Lymphocytes Absolute: 1.6 10*3/uL (ref 1.0–5.1)
MCH: 30.9 pg (ref 26.0–34.0)
MCHC: 34.1 g/dL (ref 31.0–36.0)
MCV: 90.6 fL (ref 80.0–100.0)
MPV: 10.2 fL (ref 5.0–10.5)
Monocytes %: 6.7 %
Monocytes Absolute: 0.3 10*3/uL (ref 0.0–1.3)
Neutrophils %: 57.7 %
Neutrophils Absolute: 2.7 10*3/uL (ref 1.7–7.7)
Platelets: 230 10*3/uL (ref 135–450)
RBC: 4.6 M/uL (ref 4.00–5.20)
RDW: 13.6 % (ref 12.4–15.4)
WBC: 4.7 10*3/uL (ref 4.0–11.0)

## 2014-11-01 LAB — COMPREHENSIVE METABOLIC PANEL
ALT: 13 U/L (ref 10–40)
AST: 18 U/L (ref 15–37)
Albumin/Globulin Ratio: 1.4 (ref 1.1–2.2)
Albumin: 4.3 g/dL (ref 3.4–5.0)
Alkaline Phosphatase: 87 U/L (ref 40–129)
Anion Gap: 12 (ref 3–16)
BUN: 6 mg/dL — ABNORMAL LOW (ref 7–20)
CO2: 26 mmol/L (ref 21–32)
Calcium: 9.1 mg/dL (ref 8.3–10.6)
Chloride: 104 mmol/L (ref 99–110)
Creatinine: 0.6 mg/dL (ref 0.6–1.1)
GFR African American: >60 (ref >60)
GFR Non-African American: >60 (ref >60)
Globulin: 3 g/dL
Glucose: 97 mg/dL (ref 70–99)
Potassium: 3.8 mmol/L (ref 3.5–5.1)
Sodium: 142 mmol/L (ref 136–145)
Total Bilirubin: 0.8 mg/dL (ref 0.0–1.0)
Total Protein: 7.3 g/dL (ref 6.4–8.2)

## 2014-11-01 LAB — LIPASE: Lipase: 33 U/L (ref 13.0–60.0)

## 2014-11-01 LAB — TROPONIN: Troponin: <0.01 ng/mL (ref <0.01)

## 2014-11-01 LAB — D-DIMER, QUANTITATIVE: D-Dimer, Quant: <200 ng/mL DDU (ref 0–229)

## 2014-11-01 MED ORDER — FAMOTIDINE 20 MG PO TABS
20 MG | Freq: Once | ORAL | Status: AC
Start: 2014-11-01 — End: 2014-11-01
  Administered 2014-11-01: 17:00:00 40 mg via ORAL

## 2014-11-01 MED ORDER — FAMOTIDINE 20 MG PO TABS
20 MG | ORAL_TABLET | Freq: Two times a day (BID) | ORAL | 0 refills | Status: AC
Start: 2014-11-01 — End: ?

## 2014-11-01 MED ORDER — IBUPROFEN 600 MG PO TABS
600 MG | ORAL_TABLET | Freq: Four times a day (QID) | ORAL | 0 refills | Status: DC | PRN
Start: 2014-11-01 — End: 2015-01-24

## 2014-11-01 MED ORDER — ASPIRIN 325 MG PO TABS
325 MG | Freq: Once | ORAL | Status: AC
Start: 2014-11-01 — End: 2014-11-01
  Administered 2014-11-01: 17:00:00 325 mg via ORAL

## 2014-11-01 MED FILL — FAMOTIDINE 20 MG PO TABS: 20 MG | ORAL | Qty: 2

## 2014-11-01 MED FILL — ASPIRIN 325 MG PO TABS: 325 MG | ORAL | Qty: 1

## 2014-11-01 NOTE — ED Notes (Signed)
Patient arrived to ED alert and oriented c/c chest painmid chest, that shoots to back since Thursday, EKG done and shown to ED MD immediately, Medic at bedside for line and labs.      Haskel Schroeder, RN  11/01/14 1215

## 2014-11-01 NOTE — ED Provider Notes (Signed)
[facility] EMERGENCY DEPT  eMERGENCY dEPARTMENT eNCOUnter        Pt Name: Allison Kelly  MRN: [id number]  Birthdate 1960/06/28  Date of evaluation: 11/01/2014  Provider: Tonye Royalty, PA-C  PCP: No primary care provider on file.  ED Attending: No att. providers found    CHIEF COMPLAINT       Chief Complaint   Patient presents with   ??? Chest Pain     ' through to the back, since thursday       HISTORY OF PRESENT ILLNESS   (Location/Symptom, Timing/Onset, Context/Setting, Quality, Duration, Modifying Factors, Severity)  Note limiting factors.     Allison Kelly is a 54 y.o. female  Resents to the ED by private vehicle with her family.  Patient's family acted as Equities trader for his age.  Patient complaining of substernal chest pain that radiates posteriorly to her back.  Patient states pain has been present since Thursday.  Pain is worse with inspiration.  Pain is described as a sharp sensation.  Patient denies that she had cough, shortness of breath or difficulty breathing.  She denies fever, chills or recent illness.  Patient does have some nausea, vomiting, worsening of pain after dinner only.  Patient denies any previous abdominal surgeries.  She denies any hemoptysis, recent travel/hospitalizations/surgeries, exogenous estrogen use, history of blood clots, unilateral leg swelling/pain. She denies any medical problems.     Nursing Notes were all reviewed and agreed with or any disagreements were addressed  in the HPI.    REVIEW OF SYSTEMS    (2-9 systems for level 4, 10 or more for level 5)     Review of Systems   Constitutional: Negative for chills, diaphoresis, fatigue and fever.   HENT: Negative.    Eyes: Negative.    Respiratory: Negative for cough, chest tightness and shortness of breath.    Cardiovascular: Positive for chest pain. Negative for leg swelling.   Gastrointestinal: Positive for nausea and vomiting. Negative for constipation and diarrhea.   Genitourinary: Negative.    Musculoskeletal: Positive for back  pain. Negative for neck pain.   Skin: Negative for color change, pallor and rash.   Neurological: Negative for dizziness, light-headedness and headaches.   Psychiatric/Behavioral: Negative for behavioral problems and confusion.       Positives and Pertinent negatives as per HPI.  Except as noted above in the ROS, all other systems were reviewed and negative.       PAST MEDICAL HISTORY   History reviewed. No pertinent past medical history.      SURGICAL HISTORY     History reviewed. No pertinent past surgical history.      CURRENT MEDICATIONS       Previous Medications    No medications on file         ALLERGIES     Review of patient's allergies indicates no known allergies.    FAMILY HISTORY     History reviewed. No pertinent family history.       SOCIAL HISTORY       Social History     Social History   ??? Marital status: Married     Spouse name: N/A   ??? Number of children: N/A   ??? Years of education: N/A     Social History Main Topics   ??? Smoking status: Never Smoker   ??? Smokeless tobacco: None   ??? Alcohol use No   ??? Drug use: No   ??? Sexual activity: Not Asked  Other Topics Concern   ??? None     Social History Narrative   ??? None       SCREENINGS             PHYSICAL EXAM    (up to 7 for level 4, 8 or more for level 5)   ED Triage Vitals   BP Temp Temp src Pulse Resp SpO2 Height Weight   -- -- -- -- -- -- -- --                 Physical Exam   Constitutional: She is oriented to person, place, and time. She appears well-developed and well-nourished.   HENT:   Head: Normocephalic and atraumatic.   Eyes: Conjunctivae are normal. Right eye exhibits no discharge. Left eye exhibits no discharge.   Neck: Normal range of motion. Neck supple.   Cardiovascular: Normal rate, regular rhythm and normal heart sounds.    No murmur heard.  Pulmonary/Chest: Effort normal and breath sounds normal. No respiratory distress. She exhibits no tenderness.   Abdominal: Soft. She exhibits no distension. There is no tenderness. There is no  rebound.   Musculoskeletal: Normal range of motion.   Neurological: She is alert and oriented to person, place, and time.   Skin: Skin is warm and dry. She is not diaphoretic.   Psychiatric: She has a normal mood and affect. Her behavior is normal.       DIAGNOSTIC RESULTS   LABS:    Labs Reviewed   COMPREHENSIVE METABOLIC PANEL - Abnormal; Notable for the following:     BUN 6 (*)     All other components within normal limits   TROPONIN   CBC WITH AUTO DIFFERENTIAL   LIPASE   D-DIMER, QUANTITATIVE       All other labs were within normal range or not returned as of this dictation.    EKG: All EKG's are interpreted by the Emergency Department Physician who either signs or Co-signs this chart in the absence of a cardiologist.  Please see their note for interpretation of EKG.      RADIOLOGY:   Non-plain film images such as CT, Ultrasound and MRI are read by the radiologist. Plain radiographic images are visualized and preliminarily interpreted by the  ED Provider with the below findings:    Interpretation per the Radiologist below, if available at the time of this note:    XR Chest Standard TWO VW   Final Result   No acute cardiopulmonary abnormality.         US GALLBLADDER RUQ   Final Result   Unremarkable right upper quadrant ultrasound.           No results found.      PROCEDURES   Unless otherwise noted below, none     Procedures    CRITICAL CARE TIME   N/A    CONSULTS:  None      EMERGENCY DEPARTMENT COURSE and DIFFERENTIAL DIAGNOSIS/MDM:   Vitals:    Vitals:    11/01/14 1215 11/01/14 1317 11/01/14 1343   BP: 128/90 111/78 116/83   Pulse: 65     Resp: 20     Temp: 98.8 ??F (37.1 ??C)     TempSrc: Oral     SpO2: 99% 100% 100%   Weight: 149 lb 7.6 oz (67.8 kg)     Height:  (1.549 m)         Patient was given the following medications:  Medications  aspirin tablet 325 mg (325 mg Oral Given 11/01/14 1310)   famotidine (PEPCID) tablet 40 mg (40 mg Oral Given 11/01/14 1311)   .ja    Patient presents the ED with HPI noted  above.  She is hemodynamically stable the blood pressure 116/83 and a pulse of 65.  She is afebrile and nontoxic-appearing.  She is hypoxic with SPO2 100% on room air.    Due to cardiac complaints, cardiac workup initiated.  Her cardiac workup negative. Serial troponins not warranted as chest pain has been present since last Thursday. EKG showed no ST elevation or depression, normal sinus rhythm.  Patient has a HEART SCORE of 1 based on age alone.     Due to pleuritic nature of chest pain, d dimer obtained to r/o PE. D-dimer within normal limits. Do not suspect PE.     Chest x-ray showed no acute cardiopulmonary process.    The location of pain, history of worsening pain after meals with associated nausea considered biliary colic.  Right upper quadrant ultrasound of gallbladder obtained.  Results as above, results unremarkable.  Patient will be provided prescription for Zantac for possible dyspepsia based on hx.     Other laboratory values showed no leukocytosis or anemia.  CMP showed no electrolyte abnormality, no renal or hepatic impairment.  Lipase within normal limits.  Physical exam grossly unremarkable.    His back pain is possibly a few skeletal etiology.  Patient be prescribed ibuprofen for symptoms.  Also some concern for possible dyspepsia as noted above, patient be started on Zantac.  Patient be provided the nurse practitioner on-call.  Encouraged to follow up in 2-3 days for reevaluation and care establishment.  Patient be discharged home in stable condition.  Additional workup warranted at this time.    The patient tolerated their visit well.  They were seen and evaluated by the attending physician, Dr. Moses Manners who agreed with the assessment and plan.  The patient and / or the family were informed of the results of any tests, a time was given to answer questions, a plan was proposed and they agreed with plan.      I estimate there is LOW risk for PULMONARY EMBOLISM, ACUTE CORONARY SYNDROME, OR THORACIC  AORTIC DISSECTION, thus I consider the discharge disposition reasonable.   FINAL IMPRESSION      1. Other chest pain    2. Non-intractable vomiting with nausea, vomiting of unspecified type          DISPOSITION/PLAN   DISPOSITION Decision to Discharge    PATIENT REFERRED TO:  [facility] Emergency Dept  [address] [place] [address]  [phone]    If symptoms worsen    Garvin Fila, CNP  [address] [place] [address]  [phone]            DISCHARGE MEDICATIONS:  New Prescriptions    FAMOTIDINE (PEPCID) 20 MG TABLET    Take 1 tablet by mouth 2 times daily    IBUPROFEN (ADVIL;MOTRIN) 600 MG TABLET    Take 1 tablet by mouth every 6 hours as needed for Pain       DISCONTINUED MEDICATIONS:  Discontinued Medications    No medications on file              (Please note that portions of this note were completed with a voice recognition program.  Efforts were made to edit the dictations but occasionally words are mis-transcribed.)    Tonye Royalty, PA-C (  electronically signed)           Tonye Royalty, PA-C  11/01/14 [phone]

## 2014-11-01 NOTE — ED Triage Notes (Signed)
Pt c/o chest pain and right sided pain for one week.

## 2014-11-01 NOTE — ED Provider Notes (Signed)
I independently evaluated and obtained a history and physical on Allison Kelly.    All diagnostic, treatment, and disposition assistants were made to myself in conjunction the advanced practice provider.    For further details of this patient's emergency department encounter, please see the advanced practice provider's documentation.    History: 54 yo female presents today with substernal chest pain that radiates to the back. No recent travel. No history of immobilization. Worse with inspiration. No cough or shortness of breath. No difficulty breathing. Patient also has associated nausea that is worse after eating    Physician Exam: heart regular rate and rhythm. Lungs clear to auscultation bilaterally. Patient has had some right upper quadrant epigastric abdominal tenderness.non-peritoneal.      Labs and imaging ordered accordingly. EKG reveals normal sinus rhythm rate 66 normal axis normal intervals no ST elevation or ST depression no acute ischemic changes, and normal EKG        Sylvie Farrier, MD  11/01/14 1217

## 2014-11-02 LAB — EKG 12-LEAD
Atrial Rate: 66 {beats}/min
Diagnosis: NORMAL
P Axis: 42 degrees
P-R Interval: 140 ms
Q-T Interval: 412 ms
QRS Duration: 80 ms
QTc Calculation (Bazett): 431 ms
R Axis: 13 degrees
T Axis: 16 degrees
Ventricular Rate: 66 {beats}/min

## 2015-01-24 ENCOUNTER — Emergency Department

## 2015-01-24 ENCOUNTER — Emergency Department: Admit: 2015-01-24

## 2015-01-24 ENCOUNTER — Inpatient Hospital Stay
Admit: 2015-01-24 | Discharge: 2015-01-24 | Disposition: A | Discharge status: Home / Self Care | Payer: MEDICAID | Attending: Emergency Medicine

## 2015-01-24 DIAGNOSIS — R079 Chest pain, unspecified: Secondary | ICD-10-CM

## 2015-01-24 LAB — CBC WITH AUTO DIFFERENTIAL
Basophils %: 0.5 %
Basophils Absolute: 0 10*3/uL (ref 0.0–0.2)
Eosinophils %: 1.1 %
Eosinophils Absolute: 0.1 10*3/uL (ref 0.0–0.6)
Hematocrit: 42.3 % (ref 36.0–48.0)
Hemoglobin: 14 g/dL (ref 12.0–16.0)
Lymphocytes %: 25.6 %
Lymphocytes Absolute: 1.9 10*3/uL (ref 1.0–5.1)
MCH: 29.1 pg (ref 26.0–34.0)
MCHC: 32.9 g/dL (ref 31.0–36.0)
MCV: 88.4 fL (ref 80.0–100.0)
MPV: 10.6 fL — ABNORMAL HIGH (ref 5.0–10.5)
Monocytes %: 5.5 %
Monocytes Absolute: 0.4 10*3/uL (ref 0.0–1.3)
Neutrophils %: 67.3 %
Neutrophils Absolute: 5 10*3/uL (ref 1.7–7.7)
Platelets: 243 10*3/uL (ref 135–450)
RBC: 4.79 M/uL (ref 4.00–5.20)
RDW: 14 % (ref 12.4–15.4)
WBC: 7.4 10*3/uL (ref 4.0–11.0)

## 2015-01-24 LAB — COMPREHENSIVE METABOLIC PANEL
ALT: 14 U/L (ref 10–40)
AST: 19 U/L (ref 15–37)
Albumin/Globulin Ratio: 1.6 (ref 1.1–2.2)
Albumin: 4.5 g/dL (ref 3.4–5.0)
Alkaline Phosphatase: 94 U/L (ref 40–129)
Anion Gap: 10 (ref 3–16)
BUN: 8 mg/dL (ref 7–20)
CO2: 28 mmol/L (ref 21–32)
Calcium: 9.5 mg/dL (ref 8.3–10.6)
Chloride: 102 mmol/L (ref 99–110)
Creatinine: 0.6 mg/dL (ref 0.6–1.1)
GFR African American: >60 (ref >60)
GFR Non-African American: >60 (ref >60)
Globulin: 2.9 g/dL
Glucose: 97 mg/dL (ref 70–99)
Potassium: 3.8 mmol/L (ref 3.5–5.1)
Sodium: 140 mmol/L (ref 136–145)
Total Bilirubin: 0.8 mg/dL (ref 0.0–1.0)
Total Protein: 7.4 g/dL (ref 6.4–8.2)

## 2015-01-24 LAB — TROPONIN
Troponin: <0.01 ng/mL (ref <0.01)
Troponin: <0.01 ng/mL (ref <0.01)

## 2015-01-24 LAB — APTT: aPTT: 39.1 s — ABNORMAL HIGH (ref 25.2–36.4)

## 2015-01-24 LAB — PROTIME-INR
INR: 0.91 (ref 0.85–1.16)
Protime: 10.4 s (ref 9.8–13.0)

## 2015-01-24 MED ORDER — IBUPROFEN 600 MG PO TABS
600 MG | ORAL_TABLET | Freq: Four times a day (QID) | ORAL | 0 refills | Status: AC | PRN
Start: 2015-01-24 — End: ?

## 2015-01-24 MED ORDER — KETOROLAC TROMETHAMINE 30 MG/ML IJ SOLN
30 MG/ML | Freq: Once | INTRAMUSCULAR | Status: AC
Start: 2015-01-24 — End: 2015-01-24
  Administered 2015-01-24: 18:00:00 30 mg via INTRAVENOUS

## 2015-01-24 MED FILL — KETOROLAC TROMETHAMINE 30 MG/ML IJ SOLN: 30 MG/ML | INTRAMUSCULAR | Qty: 1

## 2015-01-24 NOTE — ED Notes (Signed)
Pt resting in bed at this time. No acute distress noted. Respirations are easy and unlabored. Pt on cardiac monitor in NSR at 73 bpm. Will continue to monitor. Toradol given IV for pain. Alert and oriented x 4. Brisk cap refill with warm, well perfused extremities. No abnormal heart sounds auscultated.      Rivka SaferBreanna Whitaker, RN  01/24/15 1240

## 2015-01-24 NOTE — ED Provider Notes (Signed)
I independently performed a history and physical on Allison Kelly.   All diagnostic, treatment, and disposition decisions were made by myself in conjunction with the advanced practice provider.     Brief History of Present Illness:   Patient presents for evaluation of the chest pain has been ongoing for last 15 days.  She was previously admitted to the hospital and had a workup and monitoring for this.  She saw her primary care physician.  She presents with continued pain.  She describes it as an aching pain at the left part of her chest.  Sometimes associated with difficulty breathing and feeling like she is, suffocate.  Skin constant for the last 15 days.  Denies any recent fevers or chills, nausea, vomiting, diaphoresis, numbness or weakness.  No lower extremity erythema or edema.  I evaluated this patient after  triage and the patient had exam and it history facilitated by [employer] translator service.      Focused Physical Examination:   Triage vital signs indicate the patient is afebrile, without tachycardia, tachypnea, hypoxia or hypotension. Cardiac exam revealed a regular rate and rythm without murmur. Lungs were clear to auscultation bilaterally without accessory muscle usage or conversational dyspnea. no lower extremity erythema, edema or tenderness.      Emergency Department Course and Medical Decision Making:   the patient presents for evaluation of chest pain.  She has chronic chest pain.  She's been admitted to the hospital in the past for similar complaints.  The patient will have screening x-ray, troponin and EKG for reevaluation.  She is treated with Toradol.    Interpretation of Diagnostic Studies and Labs: CBC showed no leukocytosis. Hemoglobin revealed no anemia. There was no thrombocytosis or thrombocytopenia. BMP with normal electrolytes and anion gap. Glucose euglycemia.. BUN and Cr show normal renal function. correlation studies normal.  Troponin negative.    portal chest x-ray showed low lung  volume with basal atelectasis or pneumonitis.  The patient on examination does not have any signs concerning for pneumonia.    12-lead EKG was unremarkable.  No signs of ischemic changes.    The patient was reassessed and there is no cardiopulmonary deterioration.  The patient was discharged for outpatient follow-up.    For further details of Allison Kelly's emergency department encounter, please see documentation by advanced practice provider  Patrcia DollyJamilyn Ashley Bryant, PA.       Mal AmabileKevin Gregory-Go Yalena Colon, MD  01/24/15 [phone]

## 2015-01-24 NOTE — ED Notes (Signed)
Complains of cp x 15 days. Denies sob. Just left doctor's office for same complaint.     Shaune Leeksamara L Kalen Neidert, RN  01/24/15 [phone]

## 2015-01-24 NOTE — ED Notes (Addendum)
20 ga IV removed from right anticubital.  Using language line interpreter number [phone] reviewed blood work results, written discharge instructions with patient, answered her questions regarding follow up and diagnosis.  Patient denied further questions and verbalized understanding.  Nurse will walk with patient to discharge office and to waiting room after she finishes getting dressed.     Marissa NestleVicki L Andrian Urbach, RN  01/24/15 1522       Marissa NestleVicki L Eyvette Cordon, RN  01/24/15 [phone]

## 2015-01-24 NOTE — ED Provider Notes (Signed)
[facility] ED  eMERGENCY dEPARTMENT eNCOUnter        Pt Name: Allison Kelly  MRN: [id number]  Birthdate 03/29/1960  Date of evaluation: 01/24/2015  Provider: Patrcia Dolly, PA-C  PCP: No primary care provider on file.  ED Attending: Mal Amabile, *    CHIEF COMPLAINT       Chief Complaint   Patient presents with   ??? Chest Pain     started 15 days ago       HISTORY OF PRESENT ILLNESS   (Location/Symptom, Timing/Onset, Context/Setting, Quality, Duration, Modifying Factors, Severity)  Note limiting factors.     Allison Kelly is a 54 y.o. female  Who presents complaining of intermittent left-sided chest pain with radiation to the left shoulder and left hand.  Although she told triage nurse that this is been going on for 15 days, she admits that she has had this multiple times for several years.  She is originally from [place] and although she moved in June 2 [place], she has not found a family provider yet.  When she was in [place], she had chest pain like this also.  It is not sharp but rather feels like a pressure sensation.  She denies shortness of breath, cough, congestion, hemoptysis, palpitations, weakness, numbness, tingling, pain or swelling in extremities, unexpected weight in her weight loss, recent travel, surgery, hospitalization, prior PE or DVT, history of coronary artery disease, family history of heart disease, hypertension, hyperlipidemia or diabetes history.  When asked what she typically takes for chest pain, she states that she usually goes to the emergency department in acute worsening for pain.  Although she has been evaluated for chest pain multiple times in the past, she states that she does not believe that she has ever had a stress test.  She is not a smoker.  She is resting comfortably in bed and does not appear to be writhing in pain, pale, diaphoretic or in acute distress, despite rating her pain to be a 5 out of 10 on pain scale.  She has not  taken any medicine for symptoms since the onset.  Because is Nepali speaking only, language line [phone] used for interpretation.     Nursing Notes were all reviewed and agreed with or any disagreements were addressed  in the HPI.    REVIEW OF SYSTEMS    (2-9 systems for level 4, 10 or more for level 5)     Review of Systems   Constitutional: Negative for chills and fever.   Eyes: Negative for visual disturbance.   Respiratory: Negative for cough, shortness of breath, wheezing and stridor.    Cardiovascular: Positive for chest pain. Negative for palpitations and leg swelling.   Gastrointestinal: Negative for abdominal pain, constipation, diarrhea, nausea and vomiting.   Musculoskeletal: Negative for back pain, neck pain and neck stiffness.   Skin: Negative for color change, pallor, rash and wound.   Neurological: Negative for dizziness, weakness, light-headedness, numbness and headaches.   Psychiatric/Behavioral: Negative for confusion.   All other systems reviewed and are negative.      Positives and Pertinent negatives as per HPI.  Except as noted above in the ROS, all other systems were reviewed and negative.       PAST MEDICAL HISTORY     Past Medical History   Diagnosis Date   ??? Migraines          SURGICAL HISTORY     History reviewed. No pertinent past  surgical history.      CURRENT MEDICATIONS       Previous Medications    FAMOTIDINE (PEPCID) 20 MG TABLET    Take 1 tablet by mouth 2 times daily         ALLERGIES     Review of patient's allergies indicates no known allergies.    FAMILY HISTORY     History reviewed. No pertinent family history.       SOCIAL HISTORY       Social History     Social History   ??? Marital status: Married     Spouse name: N/A   ??? Number of children: N/A   ??? Years of education: N/A     Social History Main Topics   ??? Smoking status: Never Smoker   ??? Smokeless tobacco: None   ??? Alcohol use No   ??? Drug use: No   ??? Sexual activity: Not Asked     Other Topics Concern   ??? None     Social  History Narrative       SCREENINGS      Heart Score for chest pain patients  History: Slightly Suspicious  ECG: Normal  Patient Age: > 45 and < 65 years  Risk Factors: No risk factors known  Troponin: < 1X normal limit  Heart Score Total: 1      PHYSICAL EXAM    (up to 7 for level 4, 8 or more for level 5)   ED Triage Vitals   BP Temp Temp Source Pulse Resp SpO2 Height Weight   01/24/15 1130 01/24/15 1130 01/24/15 1130 01/24/15 1130 01/24/15 1130 01/24/15 1130 -- 01/24/15 1130   160/103 97.5 ??F (36.4 ??C) Oral 72 18 98 %  148 lb (67.1 kg)       Physical Exam   Constitutional: She is oriented to person, place, and time. She appears well-developed and well-nourished. No distress.   HENT:   Head: Normocephalic and atraumatic.   Right Ear: External ear normal.   Left Ear: External ear normal.   Nose: Nose normal.   Eyes: Conjunctivae are normal. Right eye exhibits no discharge. Left eye exhibits no discharge.   Neck: Normal range of motion. No JVD present. No Brudzinski's sign and no Kernig's sign noted.   Cardiovascular: Normal rate.    Pulmonary/Chest: Effort normal and breath sounds normal. No stridor. She has no wheezes. She has no rales. She exhibits tenderness (left anterior chest wall).   Abdominal: Soft. Bowel sounds are normal. She exhibits no abdominal bruit and no pulsatile midline mass. There is no tenderness. There is no CVA tenderness.   Musculoskeletal: Normal range of motion.   No extremity edema, posterior calf or thigh tenderness, palpable cord, discoloration.  Negative homans.  5/5 strength in all four extremities without focal weakness, paresthesia or radiculopathy with axial loading.   Lymphadenopathy:     She has no cervical adenopathy.   Neurological: She is alert and oriented to person, place, and time. No cranial nerve deficit (II-XII intact).   Skin: Skin is warm and dry. She is not diaphoretic. No pallor.   Psychiatric: She has a normal mood and affect. Her behavior is normal.   Nursing note  and vitals reviewed.      DIAGNOSTIC RESULTS   LABS:    Labs Reviewed   CBC WITH AUTO DIFFERENTIAL - Abnormal; Notable for the following:        Result Value    MPV 10.6 (*)  All other components within normal limits   APTT - Abnormal; Notable for the following:     aPTT 39.1 (*)     All other components within normal limits   COMPREHENSIVE METABOLIC PANEL   PROTIME-INR   TROPONIN   TROPONIN       All other labs were within normal range or not returned as of this dictation.    EKG: All EKG's are interpreted by the Emergency Department Physician who either signs or Co-signs this chart in the absence of a cardiologist.  Please see their note for interpretation of EKG.      RADIOLOGY:   Non-plain film images such as CT, Ultrasound and MRI are read by the radiologist. Plain radiographic images are visualized and preliminarily interpreted by the  ED Provider with the below findings:      Interpretation per the Radiologist below, if available at the time of this note:    XR Chest Portable   Final Result   Low volume study with left basilar atelectasis or pneumonitis.               PROCEDURES   Unless otherwise noted below, none     Procedures    CRITICAL CARE TIME   N/A    CONSULTS:  None      EMERGENCY DEPARTMENT COURSE and DIFFERENTIAL DIAGNOSIS/MDM:   Vitals:    Vitals:    01/24/15 1130 01/24/15 1237 01/24/15 1455   BP: (!) 160/103 128/78 132/84   Pulse: 72  76   Resp: 18  16   Temp: 97.5 ??F (36.4 ??C)     TempSrc: Oral     SpO2: 98%  98%   Weight: 148 lb (67.1 kg)         Patient was given the following medications:  Medications   ketorolac (TORADOL) injection 30 mg (30 mg Intravenous Given 01/24/15 1234)       This patient presents complaining of acute on chronic chest pain.  It is in the same distribution as her typical pain.  She does not have a family provider.  She is requesting one.   Risk factors are relatively low. Her heart score is 1.  My suspicion is low for ACS, PE, myocarditis, pericarditis,  pneumothorax, pneumonia, effusion, failure, thoracic aortic dissection, esophageal rupture, cardiac tamponade, life threatening arrhythmia or other concerning pathology.  EKG is unremarkable.  Chest x-ray is normal.  Initial and repeat troponin are negative.  She is to follow up with PCP on-call for recheck and may return ED per discharge instructions.  She does get some relief with an anti-inflammatory she'll be sent home with NSAID.  She was given strict return precautions. We have addressed concerns and expectations.     The patient tolerated their visit well.  They were seen and evaluated by the attending physician, Mal AmabileKevin Gregory-Go Belen, * who agreed with the assessment and plan.  The patient and / or the family were informed of the results of any tests, a time was given to answer questions, a plan was proposed and they agreed with plan.        FINAL IMPRESSION      1. Chest pain, unspecified type          DISPOSITION/PLAN   DISPOSITION     PATIENT REFERRED TO:  Maricela CuretEmmett C Roper, MD  [address]  [place] [id number]  [phone]    Call  for follow up with a family provider in 1-3 days  DISCHARGE MEDICATIONS:  New Prescriptions    No medications on file       DISCONTINUED MEDICATIONS:  Discontinued Medications    No medications on file              (Please note that portions of this note were completed with a voice recognition program.  Efforts were made to edit the dictations but occasionally words are mis-transcribed.)    Patrcia Dolly, PA-C (electronically signed)           Patrcia Dolly, PA-C  01/24/15 1528

## 2015-01-25 LAB — EKG 12-LEAD
Atrial Rate: 65 {beats}/min
Diagnosis: NORMAL
P Axis: 10 degrees
P-R Interval: 136 ms
Q-T Interval: 410 ms
QRS Duration: 74 ms
QTc Calculation (Bazett): 426 ms
R Axis: 5 degrees
T Axis: 5 degrees
Ventricular Rate: 65 {beats}/min

## 2015-02-24 IMAGING — CR DG HAND COMPLETE 3+V*R*
3 series · 3 of 3 positions shown · non-contrast
Comparison: None.

CLINICAL DATA: Right hand pain.

EXAM:
RIGHT HAND - COMPLETE 3+ VIEW

[x hand pa right]
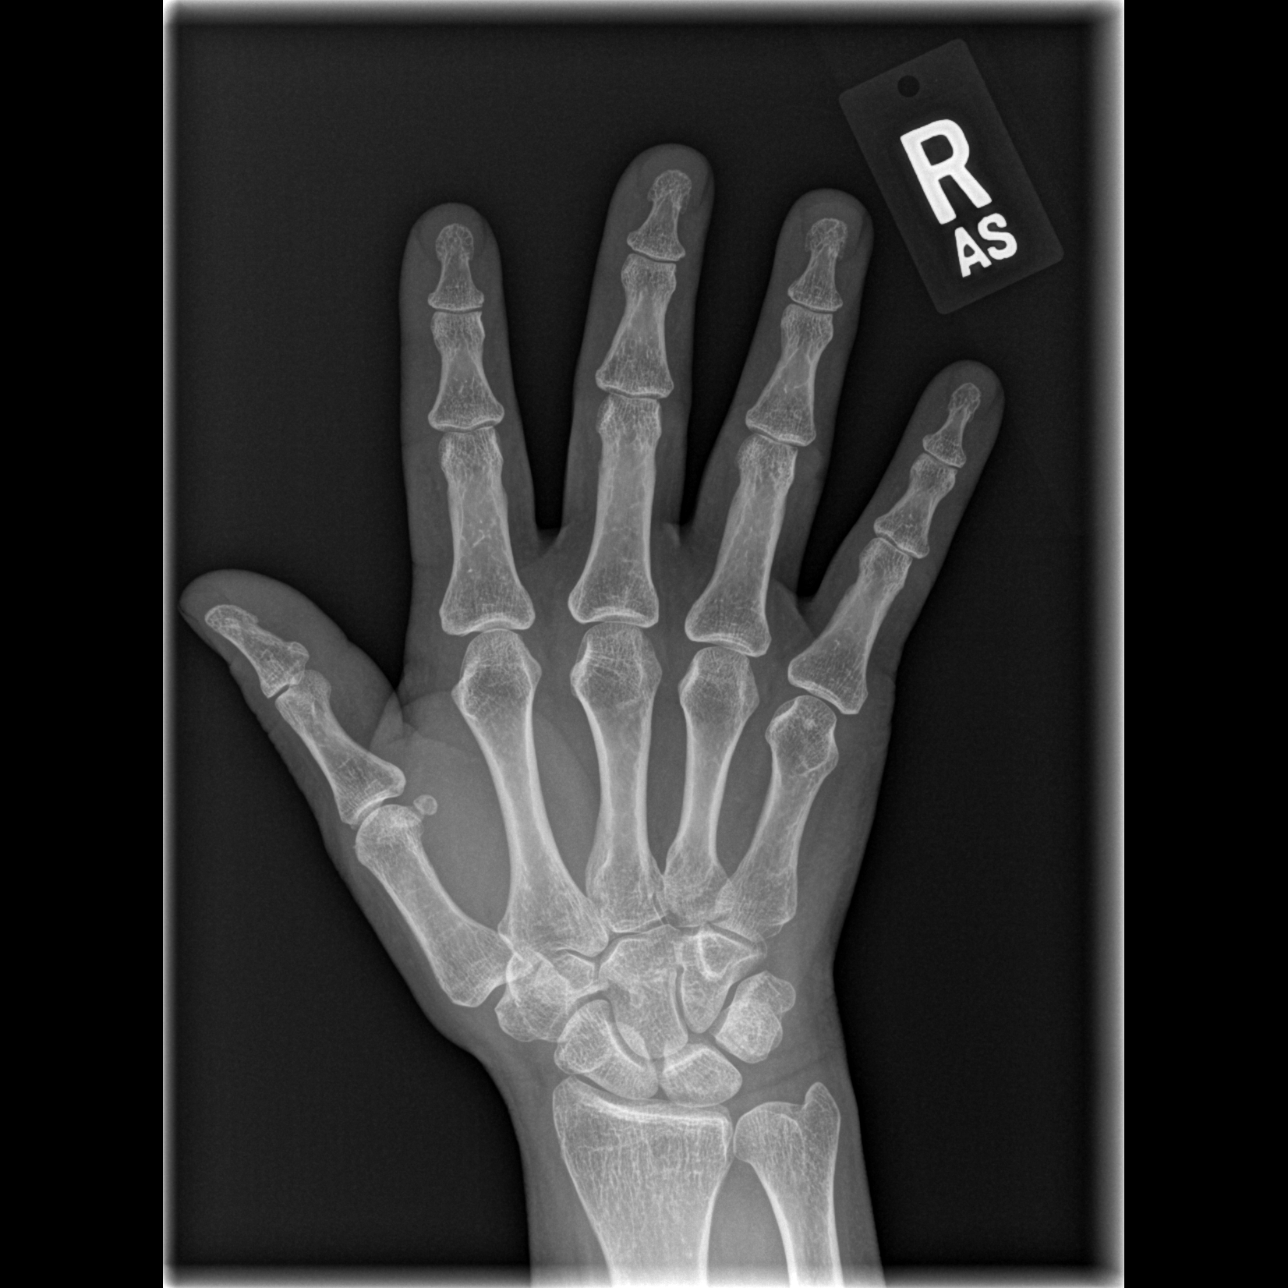

[x hand oblique right]
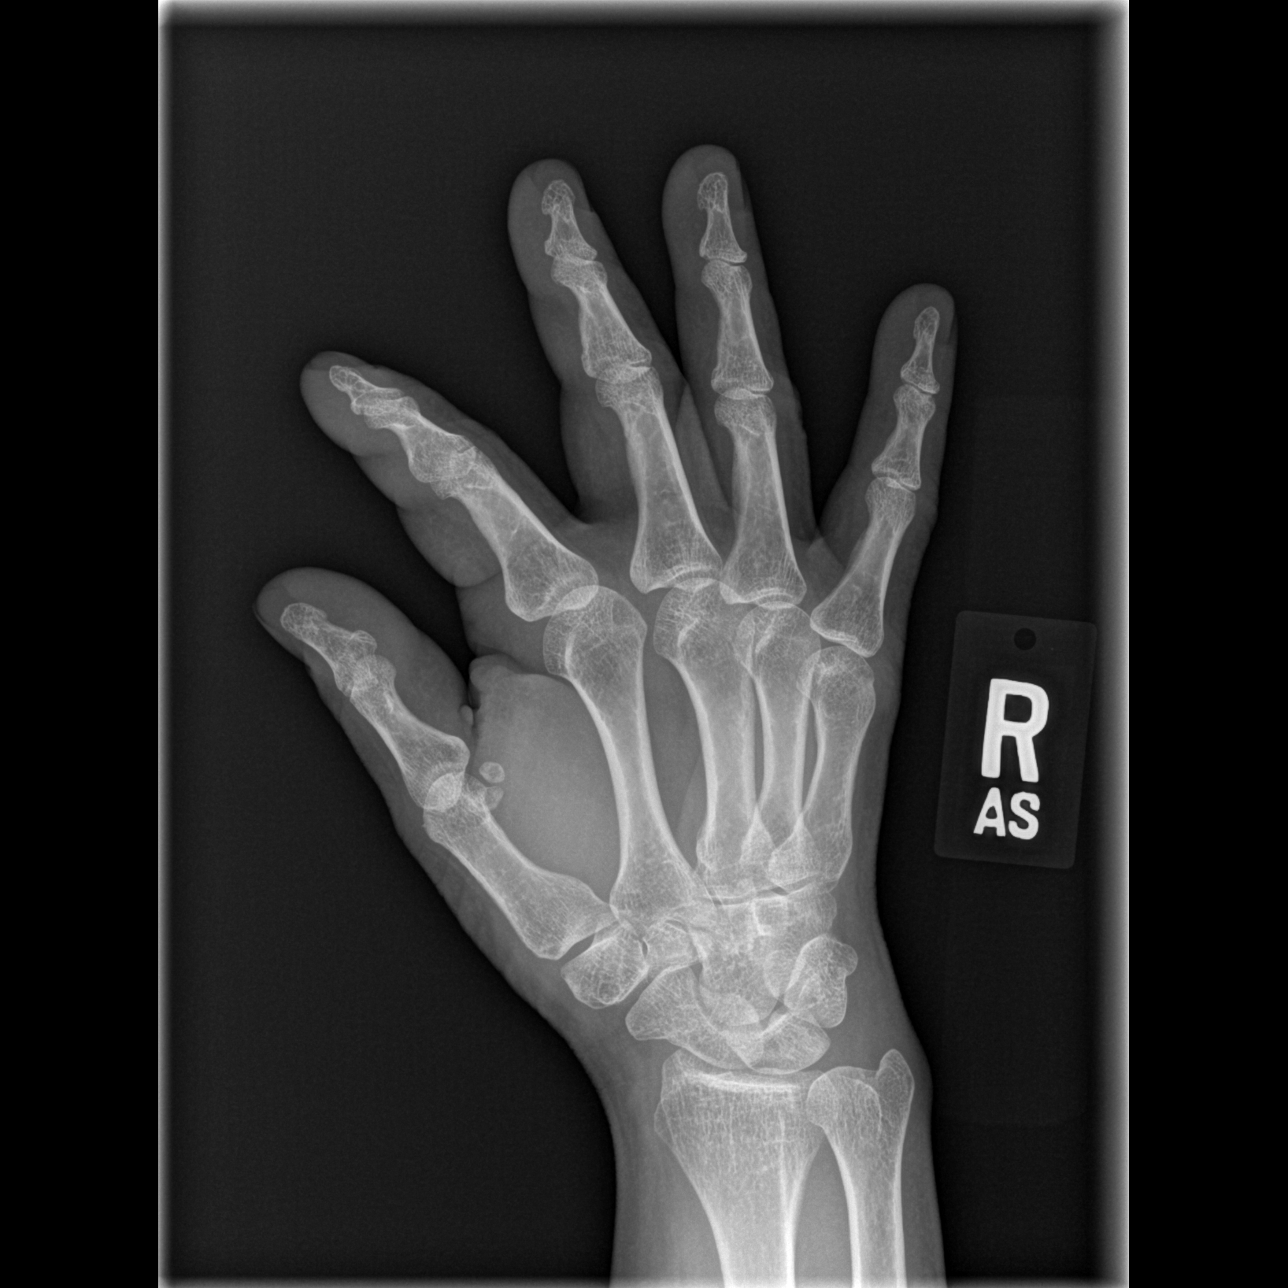

[x hand lat right]
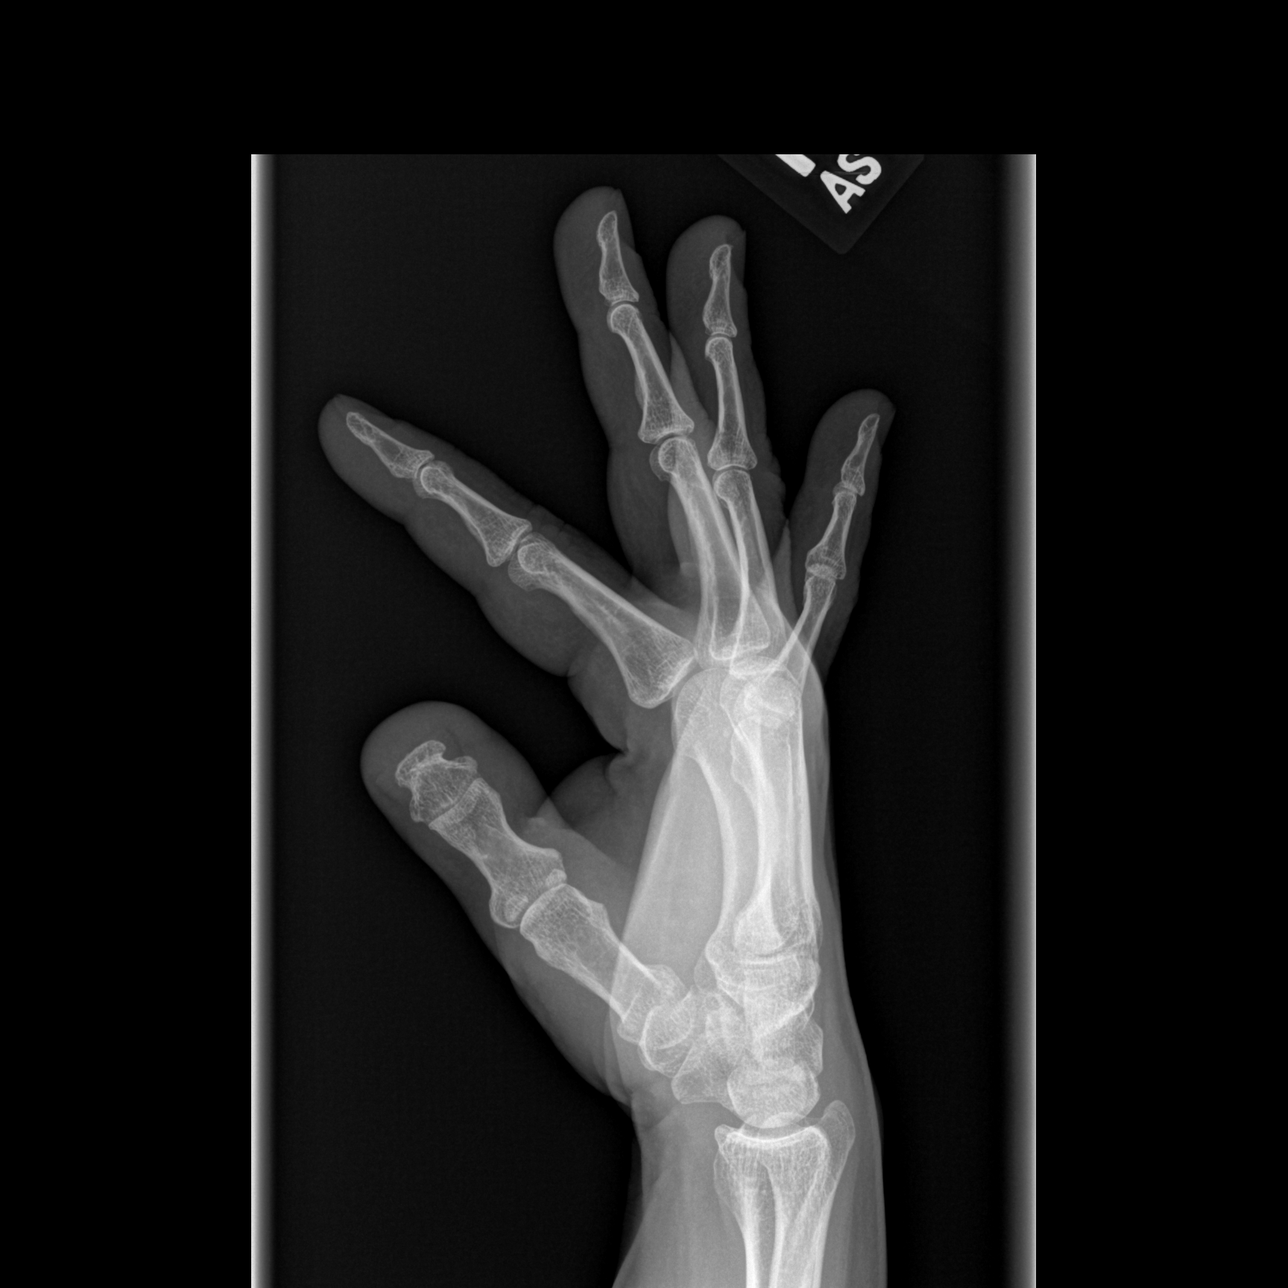

[3 of 3 positions shown; findings below may reference images not displayed]

FINDINGS: There is no evidence of fracture or dislocation. There is no
evidence of arthropathy or other focal bone abnormality. Soft
tissues are unremarkable.
IMPRESSION: Normal right hand.

## 2015-09-30 ENCOUNTER — Inpatient Hospital Stay
Admit: 2015-09-30 | Discharge: 2015-10-01 | Disposition: A | Discharge status: Home Health | Attending: Emergency Medicine

## 2015-09-30 ENCOUNTER — Emergency Department: Admit: 2015-10-01

## 2015-09-30 DIAGNOSIS — G43909 Migraine, unspecified, not intractable, without status migrainosus: Secondary | ICD-10-CM

## 2015-09-30 NOTE — ED Provider Notes (Signed)
[facility]  eMERGENCY dEPARTMENT eNCOUnter        Pt Name: Allison Kelly  MRN: [id number]  Birthdate 12/21/1960  Date of evaluation: 09/30/2015  Provider: Cain SaupeBradley J Kalima Saylor, PA-C  PCP: No primary care provider on file.  ED Attending: Riesa Popeharlene Alison Miller, *    CHIEF COMPLAINT       Chief Complaint   Patient presents with   ??? Headache     nepali interpreter - pt reports she has had a headache for a week. denies any other symptoms she did see her PCP for migraines and received "injections" but has not been given or taken any other medications for headache.        HISTORY OF PRESENT ILLNESS   (Location/Symptom, Timing/Onset, Context/Setting, Quality, Duration, Modifying Factors, Severity)  Note limiting factors.     Allison Kelly is a 55 y.o. female with a history of migraine headaches who presents to the emergency department tonight complaining of a migraine headache she has had for 5 days.  She states she saw her PCP earlier this week and was given a injection of some medication which did seem to help briefly.  She was not given any medications for home.  She denies her headache tonight being the worse headache of her life and states headache was gradual onset.  I did use interpreter [phone] to communicate with the patient as she does speak [place]ali.  She describes her headache tonight as a throbbing, constant, 9/10 pain that localizes to her frontal region and does not radiate.  She denies modifying factors.  She denies lightheadedness or dizziness.  She denies numbness, tingling or weakness in her extremities.  She denies chest pain or shortness of breath and has no other complaints.     Nursing Notes were all reviewed and agreed with or any disagreements were addressed  in the HPI.    REVIEW OF SYSTEMS    (2-9 systems for level 4, 10 or more for level 5)     Review of Systems   Constitutional: Negative for chills and fever.   Respiratory: Negative for cough, chest tightness and shortness of breath.     Cardiovascular: Negative for chest pain and palpitations.   Gastrointestinal: Negative for abdominal pain, diarrhea, nausea and vomiting.   Genitourinary: Negative for difficulty urinating, dysuria, flank pain, frequency, hematuria, urgency, vaginal bleeding, vaginal discharge and vaginal pain.   Musculoskeletal: Negative for arthralgias, back pain, neck pain and neck stiffness.   Neurological: Positive for headaches. Negative for dizziness, weakness, light-headedness and numbness.   All other systems reviewed and are negative.      Positives and Pertinent negatives as per HPI.  Except as noted above in the ROS, all other systems were reviewed and negative.       PAST MEDICAL HISTORY     Past Medical History:   Diagnosis Date   ??? Migraines          SURGICAL HISTORY     History reviewed. No pertinent surgical history.      CURRENT MEDICATIONS       Previous Medications    FAMOTIDINE (PEPCID) 20 MG TABLET    Take 1 tablet by mouth 2 times daily    IBUPROFEN (ADVIL;MOTRIN) 600 MG TABLET    Take 1 tablet by mouth every 6 hours as needed for Pain         ALLERGIES     Review of patient's allergies indicates no known allergies.    FAMILY  HISTORY     History reviewed. No pertinent family history.       SOCIAL HISTORY       Social History     Social History   ??? Marital status: Married     Spouse name: N/A   ??? Number of children: N/A   ??? Years of education: N/A     Social History Main Topics   ??? Smoking status: Never Smoker   ??? Smokeless tobacco: None   ??? Alcohol use No   ??? Drug use: No   ??? Sexual activity: Not Asked     Other Topics Concern   ??? None     Social History Narrative       SCREENINGS             PHYSICAL EXAM    (up to 7 for level 4, 8 or more for level 5)   ED Triage Vitals   BP Temp Temp src Pulse Resp SpO2 Height Weight   09/30/15 1938 09/30/15 1938 -- 09/30/15 1938 09/30/15 1938 09/30/15 1938 09/30/15 1938 09/30/15 1938   155/94 98.1 ??F (36.7 ??C)  73 14 97 % 5\' 1"  (1.549 m) 150 lb (68 kg)       Physical  Exam   Constitutional: She is oriented to person, place, and time. She appears well-developed and well-nourished. No distress.   HENT:   Head: Normocephalic and atraumatic.   Eyes: Conjunctivae and EOM are normal. Pupils are equal, round, and reactive to light. Right eye exhibits no discharge. Left eye exhibits no discharge.   Neck: Normal range of motion. Neck supple. No JVD present.   Cardiovascular: Normal rate, regular rhythm, normal heart sounds and intact distal pulses.  Exam reveals no gallop and no friction rub.    No murmur heard.  Pulmonary/Chest: Effort normal and breath sounds normal. No respiratory distress. She has no wheezes. She has no rales.   Abdominal: Soft. Bowel sounds are normal. She exhibits no distension. There is no tenderness. There is no rebound and no guarding.   Musculoskeletal: Normal range of motion.   Lymphadenopathy:     She has no cervical adenopathy.   Neurological: She is alert and oriented to person, place, and time. She has normal strength. No cranial nerve deficit or sensory deficit. Coordination and gait normal. GCS eye subscore is 4. GCS verbal subscore is 5. GCS motor subscore is 6.   Skin: Skin is warm and dry. She is not diaphoretic. No pallor.   Psychiatric: She has a normal mood and affect. Her behavior is normal.   Nursing note and vitals reviewed.      DIAGNOSTIC RESULTS   LABS:    Labs Reviewed   COMPREHENSIVE METABOLIC PANEL - Abnormal; Notable for the following:        Result Value    BUN 5 (*)     All other components within normal limits   CBC WITH AUTO DIFFERENTIAL       All other labs were within normal range or not returned as of this dictation.    RADIOLOGY:   Non-plain film images such as CT, Ultrasound and MRI are read by the radiologist. Plain radiographic images are visualized and preliminarily interpreted by the  ED Provider with the below findings:    Interpretation per the Radiologist below, if available at the time of this note:    CT Head WO Contrast    Final Result   No acute intracranial abnormality.  PROCEDURES   Unless otherwise noted below, none     Procedures    CRITICAL CARE TIME   N/A    CONSULTS:  None      EMERGENCY DEPARTMENT COURSE and DIFFERENTIAL DIAGNOSIS/MDM:   Vitals:    Vitals:    09/30/15 1938   BP: (!) 155/94   Pulse: 73   Resp: 14   Temp: 98.1 ??F (36.7 ??C)   SpO2: 97%   Weight: 150 lb (68 kg)   Height: 5\' 1"  (1.549 m)       Patient was given the following medications:  Medications   0.9 % sodium chloride infusion ( Intravenous New Bag 09/30/15 2057)   metoclopramide (REGLAN) injection 10 mg (10 mg Intravenous Given 09/30/15 2057)   diphenhydrAMINE (BENADRYL) injection 25 mg (25 mg Intravenous Given 09/30/15 2057)   ketorolac (TORADOL) injection 30 mg (30 mg Intravenous Given 09/30/15 2057)       Patient with a history of migraine headaches presented to the emergency department tonight complaining of a migraine headache she has had for 5 days.  This is not the worst headache of her life.  Headache was gradual onset.  She has no other complaints.  She saw her PCP this week who gave her a injection in her arm and she states it did help briefly.  She has no medications at home.  She is alert and oriented ??3 with GCS 15 on arrival.  She has a normal neurologic exam.  CBC, BMP and LFTs are unremarkable.  CT head completed showing no acute intracranial abnormality.  Patient is given fluids along with Toradol, Reglan and Benadryl.  She remains hemodynamically stable and nonfebrile.  On reexamination she states her headache has improved. Patient's repeat neurologic examination is normal.  My suspicion for serious pathology is low given a lack of significant risk factors and reassuring history and physical examination.  I see nothing to suggest intracranial hemorrhage, meningitis, encephalitis, mass lesion, stroke or thrombosis.  I feel the patient can be safely discharged to home with outpatient follow up.  Instructions have been given for the  patient to return if there is any significant worsening of the headache or the development of confusion, vision change, weakness, numbness, difficulty with speech or walking. Patient will be discharged with Fioricet.    The patient tolerated their visit well.  They were seen and evaluated by the attending physician, Riesa Pope, * who agreed with the assessment and plan.  The patient and / or the family were informed of the results of any tests, a time was given to answer questions, a plan was proposed and they agreed with plan.        FINAL IMPRESSION      1. Migraine without status migrainosus, not intractable, unspecified migraine type          DISPOSITION/PLAN   DISPOSITION Discharge - Pending Orders Complete    PATIENT REFERRED TO:  Your family doctor     Schedule an appointment as soon as possible for a visit        DISCHARGE MEDICATIONS:  New Prescriptions    BUTALBITAL-ACETAMINOPHEN-CAFFEINE (FIORICET) 50-325-40 MG PER TABLET    Take 1 tablet by mouth every 4 hours as needed for Headaches       DISCONTINUED MEDICATIONS:  Discontinued Medications    No medications on file              (Please note that portions of this note were completed with a  voice recognition program.  Efforts were made to edit the dictations but occasionally words are mis-transcribed.)    Cain Saupe, PA-C (electronically signed)           Cain Saupe, PA-C  09/30/15 2125

## 2015-09-30 NOTE — ED Provider Notes (Addendum)
[facility]      Pt Name: Allison Kelly  MRN: [id number]  Birthdate 10/10/1960  Date of evaluation: 09/30/2015  Provider: Sidonie DickensHARLENE A Veronique Warga, MD  I have personally performed a face to face diagnostic evaluation on this patient. I have fully participated in the care of this patient. I have reviewed and agree with all pertinent clinical information including history, physical exam, diagnostic tests, and the plan.     HPI: Allison Kelly presented with   Chief Complaint   Patient presents with   ??? Headache     nepali interpreter - pt reports she has had a headache for a week. denies any other symptoms she did see her PCP for migraines and received "injections" but has not been given or taken any other medications for headache.      Allison Kelly has a past medical history of Migraines.  She has no past surgical history on file.    Vital Signs: Blood pressure (!) 155/94, pulse 73, temperature 98.1 ??F (36.7 ??C), resp. rate 14, height 5\' 1"  (1.549 m), weight 150 lb (68 kg), SpO2 97 %.  Constitutional:  55 y.o. female nontoxic, alert  HENT:  Atraumatic, oral mucosa moist  Neck:  No visible JVD, supple  Chest/Lungs:  Respiratory effort normal  Abdomen:  Non-distended  Back:  No gross deformity  Extremities:  Normal tone and perfusion  Neurologic:  Alert, speech normal, mentation normal, pupils equal, normal coordination of extremities, no facial asymmetry, steady gait    Medical Decision Making and Plan:  I agree with Ulice BrilliantBrad Barnes, PA.  Pt has HA for one week.  Treated by her doctor with injections which temporarily improved the pain.  Long standing hx of migraines.  No other symptoms other than pain.     The patient is feeling improved after treatment.  CT does not show any structural abnormality.  We do not feel CSF studies are indicated.  She was given appropriate discharge instructions and she agrees to return to the ED promptly if worsening symptoms.  Referral given for follow up care.     Labs Reviewed    COMPREHENSIVE METABOLIC PANEL - Abnormal; Notable for the following:        Result Value    BUN 5 (*)     All other components within normal limits   CBC WITH AUTO DIFFERENTIAL     RADIOLOGY:   Ct Head Wo Contrast    Result Date: 09/30/2015  EXAMINATION: CT OF THE HEAD WITHOUT CONTRAST  09/30/2015 8:51 pm TECHNIQUE: CT of the head was performed without the administration of intravenous contrast. Dose modulation, iterative reconstruction, and/or weight based adjustment of the mA/kV was utilized to reduce the radiation dose to as low as reasonably achievable. COMPARISON: None. HISTORY: ORDERING PHYSICIAN PROVIDED HISTORY: HEADACHE TECHNOLOGIST PROVIDED HISTORY: Technologist Provided Reason for Exam: HEADACHE Acuity: Acute Type of Encounter: Initial Relevant Medical/Surgical History: Headache  nepali interpreter - pt reports she has had a headache for a week. denies any other symptoms she did see her PCP for migraines and received "injections" but has not been given or taken any other medications for headache. FINDINGS: BRAIN/VENTRICLES: There is no acute intracranial hemorrhage, mass effect or midline shift.  No abnormal extra-axial fluid collection.  The gray-white differentiation is maintained without evidence of an acute infarct.  There is no evidence of hydrocephalus. ORBITS: The visualized portion of the orbits demonstrate no acute abnormality. SINUSES: The visualized paranasal sinuses and mastoid air cells demonstrate no  acute abnormality. SOFT TISSUES/SKULL:  No acute abnormality of the visualized skull or soft tissues.     No acute intracranial abnormality.     Medications administered:  Medications   0.9 % sodium chloride infusion ( Intravenous New Bag 09/30/15 2057)   metoclopramide (REGLAN) injection 10 mg (10 mg Intravenous Given 09/30/15 2057)   diphenhydrAMINE (BENADRYL) injection 25 mg (25 mg Intravenous Given 09/30/15 2057)   ketorolac (TORADOL) injection 30 mg (30 mg Intravenous Given 09/30/15 2057)     New  Prescriptions    BUTALBITAL-ACETAMINOPHEN-CAFFEINE (FIORICET) 50-325-40 MG PER TABLET    Take 1 tablet by mouth every 4 hours as needed for Headaches     FOLLOW UP:    Your family doctor     Schedule an appointment as soon as possible for a visit      FINAL IMPRESSION:    1. Migraine without status migrainosus, not intractable, unspecified migraine type         Riesa Pope, MD  09/30/15 2139

## 2015-10-01 LAB — COMPREHENSIVE METABOLIC PANEL
ALT: 17 U/L (ref 10–40)
AST: 19 U/L (ref 15–37)
Albumin/Globulin Ratio: 1.4 (ref 1.1–2.2)
Albumin: 4.5 g/dL (ref 3.4–5.0)
Alkaline Phosphatase: 106 U/L (ref 40–129)
Anion Gap: 12 (ref 3–16)
BUN: 5 mg/dL — ABNORMAL LOW (ref 7–20)
CO2: 28 mmol/L (ref 21–32)
Calcium: 9.6 mg/dL (ref 8.3–10.6)
Chloride: 105 mmol/L (ref 99–110)
Creatinine: 0.6 mg/dL (ref 0.6–1.1)
GFR African American: >60 (ref >60)
GFR Non-African American: >60 (ref >60)
Globulin: 3.2 g/dL
Glucose: 92 mg/dL (ref 70–99)
Potassium: 3.7 mmol/L (ref 3.5–5.1)
Sodium: 145 mmol/L (ref 136–145)
Total Bilirubin: 0.8 mg/dL (ref 0.0–1.0)
Total Protein: 7.7 g/dL (ref 6.4–8.2)

## 2015-10-01 LAB — CBC WITH AUTO DIFFERENTIAL
Basophils %: 0.6 %
Basophils Absolute: 0 10*3/uL (ref 0.0–0.2)
Eosinophils %: 2.2 %
Eosinophils Absolute: 0.2 10*3/uL (ref 0.0–0.6)
Hematocrit: 43.1 % (ref 36.0–48.0)
Hemoglobin: 14.1 g/dL (ref 12.0–16.0)
Lymphocytes %: 33.4 %
Lymphocytes Absolute: 2.4 10*3/uL (ref 1.0–5.1)
MCH: 29.7 pg (ref 26.0–34.0)
MCHC: 32.6 g/dL (ref 31.0–36.0)
MCV: 91 fL (ref 80.0–100.0)
MPV: 9.7 fL (ref 5.0–10.5)
Monocytes %: 6.8 %
Monocytes Absolute: 0.5 10*3/uL (ref 0.0–1.3)
Neutrophils %: 57 %
Neutrophils Absolute: 4.2 10*3/uL (ref 1.7–7.7)
Platelets: 255 10*3/uL (ref 135–450)
RBC: 4.73 M/uL (ref 4.00–5.20)
RDW: 13.6 % (ref 12.4–15.4)
WBC: 7.3 10*3/uL (ref 4.0–11.0)

## 2015-10-01 MED ORDER — BUTALBITAL-APAP-CAFFEINE 50-325-40 MG PO TABS
50-325-40 MG | ORAL_TABLET | ORAL | 0 refills | Status: AC | PRN
Start: 2015-10-01 — End: ?

## 2015-10-01 MED ORDER — SODIUM CHLORIDE 0.9 % IV SOLN
0.9 % | INTRAVENOUS | Status: DC
Start: 2015-10-01 — End: 2015-10-01
  Administered 2015-10-01: 01:00:00 via INTRAVENOUS

## 2015-10-01 MED ORDER — KETOROLAC TROMETHAMINE 30 MG/ML IJ SOLN
30 MG/ML | Freq: Once | INTRAMUSCULAR | Status: AC
Start: 2015-10-01 — End: 2015-09-30
  Administered 2015-10-01: 01:00:00 30 mg via INTRAVENOUS

## 2015-10-01 MED ORDER — DIPHENHYDRAMINE HCL 50 MG/ML IJ SOLN
50 MG/ML | Freq: Once | INTRAMUSCULAR | Status: AC
Start: 2015-10-01 — End: 2015-09-30
  Administered 2015-10-01: 01:00:00 25 mg via INTRAVENOUS

## 2015-10-01 MED ORDER — METOCLOPRAMIDE HCL 5 MG/ML IJ SOLN
5 MG/ML | Freq: Once | INTRAMUSCULAR | Status: AC
Start: 2015-10-01 — End: 2015-09-30
  Administered 2015-10-01: 01:00:00 10 mg via INTRAVENOUS

## 2015-10-01 MED FILL — METOCLOPRAMIDE HCL 5 MG/ML IJ SOLN: 5 MG/ML | INTRAMUSCULAR | Qty: 2

## 2015-10-01 MED FILL — SODIUM CHLORIDE 0.9 % IV SOLN: 0.9 % | INTRAVENOUS | Qty: 1000

## 2015-10-01 MED FILL — DIPHENHYDRAMINE HCL 50 MG/ML IJ SOLN: 50 MG/ML | INTRAMUSCULAR | Qty: 1

## 2015-10-01 MED FILL — KETOROLAC TROMETHAMINE 30 MG/ML IJ SOLN: 30 MG/ML | INTRAMUSCULAR | Qty: 1

## 2015-12-28 ENCOUNTER — Encounter: Admit: 2015-12-28

## 2015-12-28 ENCOUNTER — Inpatient Hospital Stay
Admit: 2015-12-28 | Discharge: 2015-12-29 | Disposition: A | Discharge status: Home / Self Care | Payer: MEDICAID | Attending: Emergency Medicine

## 2015-12-28 DIAGNOSIS — R0789 Other chest pain: Secondary | ICD-10-CM

## 2015-12-28 LAB — CBC WITH AUTO DIFFERENTIAL
Basophils %: 0.7 %
Basophils Absolute: 0 10*3/uL (ref 0.0–0.2)
Eosinophils %: 2 %
Eosinophils Absolute: 0.1 10*3/uL (ref 0.0–0.6)
Hematocrit: 40.7 % (ref 36.0–48.0)
Hemoglobin: 13.7 g/dL (ref 12.0–16.0)
Lymphocytes %: 30.7 %
Lymphocytes Absolute: 1.7 10*3/uL (ref 1.0–5.1)
MCH: 30 pg (ref 26.0–34.0)
MCHC: 33.7 g/dL (ref 31.0–36.0)
MCV: 89.2 fL (ref 80.0–100.0)
MPV: 10.6 fL — ABNORMAL HIGH (ref 5.0–10.5)
Monocytes %: 6.9 %
Monocytes Absolute: 0.4 10*3/uL (ref 0.0–1.3)
Neutrophils %: 59.7 %
Neutrophils Absolute: 3.2 10*3/uL (ref 1.7–7.7)
Platelets: 194 10*3/uL (ref 135–450)
RBC: 4.56 M/uL (ref 4.00–5.20)
RDW: 13.8 % (ref 12.4–15.4)
WBC: 5.4 10*3/uL (ref 4.0–11.0)

## 2015-12-28 LAB — BASIC METABOLIC PANEL
Anion Gap: 9 (ref 3–16)
BUN: 8 mg/dL (ref 7–20)
CO2: 26 mmol/L (ref 21–32)
Calcium: 9.3 mg/dL (ref 8.3–10.6)
Chloride: 107 mmol/L (ref 99–110)
Creatinine: 0.6 mg/dL (ref 0.6–1.1)
GFR African American: >60 (ref >60)
GFR Non-African American: >60 (ref >60)
Glucose: 96 mg/dL (ref 70–99)
Potassium: 4.4 mmol/L (ref 3.5–5.1)
Sodium: 142 mmol/L (ref 136–145)

## 2015-12-28 LAB — PROTIME-INR
INR: 0.98 (ref 0.85–1.15)
Protime: 11.1 s (ref 9.6–13.0)

## 2015-12-28 LAB — APTT: aPTT: 34.2 s (ref 24.1–34.9)

## 2015-12-28 LAB — BRAIN NATRIURETIC PEPTIDE: Pro-BNP: 9 pg/mL (ref 0–124)

## 2015-12-28 LAB — TROPONIN: Troponin: <0.01 ng/mL (ref <0.01)

## 2015-12-28 NOTE — ED Notes (Signed)
IV started per protocol, 20g in right upper arm. Pt placed on continuous pulse ox, tele monitoring, and BP set to cycle. Pt denies further needs at this time, will continue to monitor.      Ellwood HandlerDanielle C Shadd Dunstan, RN  12/28/15 2022

## 2015-12-28 NOTE — ED Provider Notes (Signed)
CHIEF COMPLAINT  Chest Pain (Pt with 5 days of chest pain and headache. Pt also with c/o left leg pain.)      HISTORY OF PRESENT ILLNESS  Allison Kelly is a 55 y.o. female with history of gastroesophageal reflux disease presents to the ED with chest discomfort ??5 days.  Patient notes discomfort to be burning in nature, located retrosternally/epigastric region without radiation.  She denies associated diaphoresis, nausea, vomiting, fever, chills, shortness of breath or any additional acute complaints or concerns at this time.  Patient does note that she has this discomfort "every couple of months."  She has been evaluated multiple times for similar symptoms, and ultimately diagnosed with gastroesophageal reflux per patient.  She has not been taking any PPI or H2 antagonists, however is requesting prescription for nystatin medication today.  No other complaints, modifying factors or associated symptoms.  She denies history of hypertension, hyperlipidemia, diabetes, known cardiac disease, family history of cardiac problems.    I have reviewed the following from the nursing documentation.    Past Medical History:   Diagnosis Date   ??? Migraines      History reviewed. No pertinent surgical history.  History reviewed. No pertinent family history.  Social History     Social History   ??? Marital status: Married     Spouse name: N/A   ??? Number of children: N/A   ??? Years of education: N/A     Occupational History   ??? Not on file.     Social History Main Topics   ??? Smoking status: Never Smoker   ??? Smokeless tobacco: Not on file   ??? Alcohol use No   ??? Drug use: No   ??? Sexual activity: Not on file     Other Topics Concern   ??? Not on file     Social History Narrative   ??? No narrative on file     No current facility-administered medications for this encounter.      Current Outpatient Prescriptions   Medication Sig Dispense Refill   ??? ranitidine (ZANTAC) 150 MG tablet Take 1 tablet by mouth 2 times daily 60 tablet 0   ???  butalbital-acetaminophen-caffeine (FIORICET) 50-325-40 MG per tablet Take 1 tablet by mouth every 4 hours as needed for Headaches 20 tablet 0   ??? ibuprofen (ADVIL;MOTRIN) 600 MG tablet Take 1 tablet by mouth every 6 hours as needed for Pain 30 tablet 0   ??? famotidine (PEPCID) 20 MG tablet Take 1 tablet by mouth 2 times daily 28 tablet 0     No Known Allergies    REVIEW OF SYSTEMS  10 systems reviewed, pertinent positives per HPI otherwise noted to be negative.    PHYSICAL EXAM  BP (!) 143/89    Pulse 73    Temp 98.1 ??F (36.7 ??C) (Infrared)    Resp 17    Ht 5\' 1"  (1.549 m)    Wt 149 lb 14.6 oz (68 kg)    SpO2 97%    BMI 28.33 kg/m??   GENERAL APPEARANCE: Awake and alert. Cooperative. In no apparent distress  HEAD: Normocephalic. Atraumatic.  EYES: PERRL. EOM's grossly intact.   ENT: Mucous membranes are moist.   NECK: Supple.   HEART: RRR. No murmurs.  No reproducible chest wall pain.  LUNGS: Respirations unlabored. Lungs are clear to auscultation, bilaterally.   ABDOMEN: Soft. Non-distended. Non-tender. No guarding or rebound. Normal bowel sounds.  EXTREMITIES: No peripheral edema. Moves all extremities equally. All extremities neurovascularly  intact.   SKIN: Warm and dry. No acute rashes.   NEUROLOGICAL: Alert and oriented. No gross facial drooping. Strength 5/5, sensation intact. No truncal ataxia.   PSYCHIATRIC: Normal mood and affect.    LABS  I have reviewed all labs for this visit.   Results for orders placed or performed during the hospital encounter of 12/28/15   CBC auto differential   Result Value Ref Range    WBC 5.4 4.0 - 11.0 K/uL    RBC 4.56 4.00 - 5.20 M/uL    Hemoglobin 13.7 12.0 - 16.0 g/dL    Hematocrit 29.540.7 62.136.0 - 48.0 %    MCV 89.2 80.0 - 100.0 fL    MCH 30.0 26.0 - 34.0 pg    MCHC 33.7 31.0 - 36.0 g/dL    RDW 30.813.8 65.712.4 - 84.615.4 %    Platelets 194 135 - 450 K/uL    MPV 10.6 (H) 5.0 - 10.5 fL    Neutrophils % 59.7 %    Lymphocytes % 30.7 %    Monocytes % 6.9 %    Eosinophils % 2.0 %    Basophils %  0.7 %    Neutrophils # 3.2 1.7 - 7.7 K/uL    Lymphocytes # 1.7 1.0 - 5.1 K/uL    Monocytes # 0.4 0.0 - 1.3 K/uL    Eosinophils # 0.1 0.0 - 0.6 K/uL    Basophils # 0.0 0.0 - 0.2 K/uL   Basic Metabolic Panel   Result Value Ref Range    Sodium 142 136 - 145 mmol/L    Potassium 4.4 3.5 - 5.1 mmol/L    Chloride 107 99 - 110 mmol/L    CO2 26 21 - 32 mmol/L    Anion Gap 9 3 - 16    Glucose 96 70 - 99 mg/dL    BUN 8 7 - 20 mg/dL    CREATININE 0.6 0.6 - 1.1 mg/dL    GFR Non-African American >60 >60    GFR African American >60 >60    Calcium 9.3 8.3 - 10.6 mg/dL   APTT   Result Value Ref Range    aPTT 34.2 24.1 - 34.9 sec   Protime-INR   Result Value Ref Range    Protime 11.1 9.6 - 13.0 sec    INR 0.98 0.85 - 1.15   Troponin   Result Value Ref Range    Troponin <0.01 <0.01 ng/mL   Brain Natriuretic Peptide   Result Value Ref Range    Pro-BNP 9 0 - 124 pg/mL   Troponin   Result Value Ref Range    Troponin <0.01 <0.01 ng/mL       EKG  The Ekg interpreted by me shows  normal sinus rhythm with a rate of 62  Axis is   Normal  QTc is  normal  Intervals and Durations are unremarkable.      ST Segments: no acute change  *note: Initial EKG concerning for possible T-wave inversion to lead V2.  EKG is repeated, and T waves appear operate.        RADIOLOGY  Xr Chest Standard (2 Vw)    Result Date: 12/28/2015  EXAMINATION: TWO VIEWS OF THE CHEST 12/28/2015 5:31 pm COMPARISON: 01/24/2015 HISTORY: ORDERING PHYSICIAN PROVIDED HISTORY: cp TECHNOLOGIST PROVIDED HISTORY: Technologist Provided Reason for Exam: chest pain Acuity: Unknown Type of Encounter: Unknown FINDINGS: There is minimal left mid lung atelectasis.  Right lung is clear.  Cardiac silhouette at the upper limits of normal.  Bones are unremarkable RECOMMENDATIONS: Minimal left mid lung atelectasis         PROCEDURES    ED COURSE/MDM  Patient seen and evaluated. Old records reviewed. Labs and imaging reviewed and results discussed with patient using an interpreter line.  EKG completed  ??2 secondary to concern for flattening of T-wave in V2.  Repeat EKG showing normal sinus rhythm at 62 bpm without any ischemic type changes.  Troponin negative ??2.  Patient feeling improved in the emergency department following a menstruation of Pepcid.  Patient stating that she believes is burning discomfort is secondary to her gastroesophageal reflux disease, however didn't want to be evaluated today.  He is advised that she'll require prompt primary care follow-up in 1-2 days for reevaluation, and is also advised to return immediately to the emergency department should her symptoms worsen or she has any concerns prior to the follow-up.  Patient verbalizes understanding of plan with interpreter line.  Will discharge at this time.      I considered acute myocardial infarction, and STEMI, gastroenteritis, gastroesophageal reflux, pneumonia.      Patient was given the following medications in the ED: Pepcid    Plan of care discussed with patient, who is agreeable to plan.    Patient was given scripts for the following medications. I counseled patient how to take these medications.   New Prescriptions    RANITIDINE (ZANTAC) 150 MG TABLET    Take 1 tablet by mouth 2 times daily       CLINICAL IMPRESSION  1. Atypical chest pain    2. Gastroesophageal reflux disease, esophagitis presence not specified        Blood pressure (!) 143/89, pulse 73, temperature 98.1 ??F (36.7 ??C), temperature source Infrared, resp. rate 17, height 5\' 1"  (1.549 m), weight 149 lb 14.6 oz (68 kg), SpO2 97 %.    DISPOSITION  Allison Kelly was discharged to home in stable condition.                 Beatriz Chancellor, DO  12/28/15 2316

## 2015-12-28 NOTE — ED Notes (Signed)
Reviewed discharge instructions with patient via interpretor [phone]. No questions at this time. No sign of distress. AOx3. Pt ambulated to ER exit with steady gait.        Leland HerJeffrey A Zamyra Allensworth, RN  12/28/15 [phone]

## 2015-12-29 LAB — EKG 12-LEAD
Atrial Rate: 62 {beats}/min
Atrial Rate: 68 {beats}/min
Diagnosis: NORMAL
Diagnosis: NORMAL
P Axis: 13 degrees
P Axis: 35 degrees
P-R Interval: 124 ms
P-R Interval: 130 ms
Q-T Interval: 384 ms
Q-T Interval: 430 ms
QRS Duration: 78 ms
QRS Duration: 78 ms
QTc Calculation (Bazett): 408 ms
QTc Calculation (Bazett): 436 ms
R Axis: -3 degrees
R Axis: 52 degrees
T Axis: -12 degrees
T Axis: 6 degrees
Ventricular Rate: 62 {beats}/min
Ventricular Rate: 68 {beats}/min

## 2015-12-29 LAB — TROPONIN: Troponin: <0.01 ng/mL (ref <0.01)

## 2015-12-29 MED ORDER — RANITIDINE HCL 150 MG PO TABS
150 MG | ORAL_TABLET | Freq: Two times a day (BID) | ORAL | 0 refills | Status: AC
Start: 2015-12-29 — End: ?

## 2015-12-29 MED ORDER — FAMOTIDINE 20 MG/2ML IV SOLN
20 MG/2ML | Freq: Once | INTRAVENOUS | Status: AC
Start: 2015-12-29 — End: 2015-12-28
  Administered 2015-12-29: 02:00:00 20 mg via INTRAVENOUS

## 2015-12-29 MED FILL — FAMOTIDINE 20 MG/2ML IV SOLN: 20 MG/2ML | INTRAVENOUS | Qty: 2

## 2017-08-19 ENCOUNTER — Inpatient Hospital Stay: Admit: 2017-08-19 | Payer: MEDICAID

## 2017-08-19 ENCOUNTER — Encounter

## 2017-08-19 ENCOUNTER — Inpatient Hospital Stay: Payer: MEDICAID

## 2017-08-19 DIAGNOSIS — M79672 Pain in left foot: Secondary | ICD-10-CM

## 2017-10-08 ENCOUNTER — Ambulatory Visit: Admit: 2017-10-08 | Discharge: 2017-10-08 | Payer: MEDICAID | Attending: Internal Medicine

## 2017-10-08 DIAGNOSIS — R109 Unspecified abdominal pain: Secondary | ICD-10-CM

## 2017-10-08 MED ORDER — ZOSTER VAC RECOMB ADJUVANTED 50 MCG/0.5ML IM SUSR
500.5 MCG/0.5ML | Freq: Once | INTRAMUSCULAR | 1 refills | Status: DC
Start: 2017-10-08 — End: 2017-10-08

## 2017-10-08 MED ORDER — TETANUS-DIPHTH-ACELL PERTUSSIS 5-2-15.5 LF-MCG/0.5 IM SUSP
Freq: Once | INTRAMUSCULAR | 0 refills | Status: DC
Start: 2017-10-08 — End: 2017-10-08

## 2017-10-08 NOTE — Progress Notes (Signed)
Allison Kelly  Date of Birth:  10/05/1960    Date of Service:  10/08/2017    Chief Complaint   Patient presents with   . Establish Care     The patient continues to see her previous primary care physician.  It appears that this was a misunderstanding and the patient was directed here for a CAT scan of her abdomen for chronic abdominal pain by her primary care physician.  I have asked her to continue follow-up with her current primary care physician and to get the CAT scan that was ordered by him.  I will not be following the patient.    HPI    No Known Allergies  Outpatient Medications Marked as Taking for the 10/08/17 encounter (Office Visit) with Jodell Ciprourtis William Roxana Lai, MD   Medication Sig Dispense Refill   . hydrOXYzine (ATARAX) 50 MG tablet Take 50 mg by mouth daily as needed for Itching     . naproxen (NAPROSYN) 500 MG tablet Take 500 mg by mouth 2 times daily (with meals)     . sertraline (ZOLOFT) 50 MG tablet Take 50 mg by mouth daily     . Tdap (ADACEL) 06-25-13.5 LF-MCG/0.5 injection Inject 0.5 mLs into the muscle once for 1 dose 0.5 mL 0   . zoster recombinant adjuvanted vaccine (SHINGRIX) 50 MCG/0.5ML SUSR injection Inject 0.5 mLs into the muscle once for 1 dose 50 MCG IM then repeat 2-6 months. 1 each 1   . butalbital-acetaminophen-caffeine (FIORICET) 50-325-40 MG per tablet Take 1 tablet by mouth every 4 hours as needed for Headaches 20 tablet 0   . ibuprofen (ADVIL;MOTRIN) 600 MG tablet Take 1 tablet by mouth every 6 hours as needed for Pain 30 tablet 0         There is no immunization history on file for this patient.    Past Medical History:   Diagnosis Date   . Migraines      No past surgical history on file.  No family history on file.    Review of Systems:  Review of Systems    Vitals:    10/08/17 1536   BP: (!) 157/94   Pulse: 74   SpO2: 97%   Weight: 153 lb (69.4 kg)     Body mass index is 28.91 kg/m.     Wt Readings from Last 3 Encounters:   10/08/17 153 lb (69.4 kg)   12/28/15 149 lb 14.6 oz (68  kg)   09/30/15 150 lb (68 kg)     BP Readings from Last 3 Encounters:   10/08/17 (!) 157/94   12/28/15 125/77   09/30/15 138/82         Physical Exam    Lab Review   No visits with results within 6 Month(s) from this visit.   Latest known visit with results is:   Admission on 12/28/2015, Discharged on 12/28/2015   Component Date Value   . WBC 12/28/2015 5.4    . RBC 12/28/2015 4.56    . Hemoglobin 12/28/2015 13.7    . Hematocrit 12/28/2015 40.7    . MCV 12/28/2015 89.2    . [facility] 12/[facility] 30.0    . MCHC 12/28/2015 33.7    . RDW 12/28/2015 13.8    . Platelets 12/28/2015 194    . MPV 12/28/2015 10.6*   . Neutrophils % 12/28/2015 59.7    . Lymphocytes % 12/28/2015 30.7    . Monocytes % 12/28/2015 6.9    . Eosinophils % 12/28/2015 2.0    .  Basophils % 12/28/2015 0.7    . Neutrophils # 12/28/2015 3.2    . Lymphocytes # 12/28/2015 1.7    . Monocytes # 12/28/2015 0.4    . Eosinophils # 12/28/2015 0.1    . Basophils # 12/28/2015 0.0    . Sodium 12/28/2015 142    . Potassium 12/28/2015 4.4    . Chloride 12/28/2015 107    . CO2 12/28/2015 26    . Anion Gap 12/28/2015 9    . Glucose 12/28/2015 96    . BUN 12/28/2015 8    . CREATININE 12/28/2015 0.6    . GFR Non-African American 12/28/2015 >60    . GFR African American 12/28/2015 >60    . Calcium 12/28/2015 9.3    . aPTT 12/28/2015 34.2    . Protime 12/28/2015 11.1    . INR 12/28/2015 0.98    . Troponin 12/28/2015 <0.01    . Pro-BNP 12/28/2015 9    . Ventricular Rate 12/28/2015 68    . Atrial Rate 12/28/2015 68    . P-R Interval 12/28/2015 130    . QRS Duration 12/28/2015 78    . Q-T Interval 12/28/2015 384    . QTc Calculation (Bazett) 12/28/2015 408    . P Axis 12/28/2015 13    . R Axis 12/28/2015 52    . T Axis 12/28/2015 -12    . Diagnosis 12/28/2015                      Value:Normal sinus rhythm with sinus arrhythmia  Nonspecific T wave abnormality  Abnormal ECG    Confirmed by HUTCHINS MD, MATTHEW ([id number]) on 12/29/2015 2:11:10 PM     . Troponin 12/28/2015 <0.01    .  Ventricular Rate 12/28/2015 62    . Atrial Rate 12/28/2015 62    . P-R Interval 12/28/2015 124    . QRS Duration 12/28/2015 78    . Q-T Interval 12/28/2015 430    . QTc Calculation (Bazett) 12/28/2015 436    . P Axis 12/28/2015 35    . R Axis 12/28/2015 -3    . T Axis 12/28/2015 6    . Diagnosis 12/28/2015                      Value:Normal sinus rhythm    Confirmed by HUTCHINS MD, MATTHEW ([id number]) on 12/29/2015 2:11:14 PM       notapplicable      Health Maintenance   Topic Date Due   . Hepatitis C screen  1960/06/26   . HIV screen  01/24/1978   . DTaP/Tdap/Td vaccine (1 - Tdap) 01/24/1982   . Cervical cancer screen  01/25/1984   . Lipid screen  01/25/2003   . Breast cancer screen  01/24/2013   . Shingles Vaccine (1 of 2) 01/24/2013   . Colon cancer screen colonoscopy  01/24/2013   . Flu vaccine (1) 10/25/2017   . Pneumococcal 0-64 years Vaccine  Aged Out          Assessment/Plan:     Abdominal pain.  Continue follow-up with your current primary care physician.       No follow-ups on file.

## 2017-10-08 NOTE — Patient Instructions (Signed)
The patient was instructed to continue follow-up with her current primary care physician.

## 2017-10-09 LAB — HIV SCREEN
HIV ANTIGEN: NONREACTIVE
HIV Ag/Ab: NONREACTIVE
HIV-1 Antibody: NONREACTIVE
HIV-2 Ab: NONREACTIVE

## 2017-10-09 LAB — LIPID PANEL
Cholesterol, Total: 216 mg/dL — ABNORMAL HIGH (ref 0–199)
HDL: 64 mg/dL — ABNORMAL HIGH (ref 40–60)
LDL Calculated: 121 mg/dL — ABNORMAL HIGH (ref <100)
Triglycerides: 156 mg/dL — ABNORMAL HIGH (ref 0–150)
VLDL Cholesterol Calculated: 31 mg/dL

## 2017-10-09 LAB — HEPATITIS C ANTIBODY: Hep C Ab Interp: NONREACTIVE

## 2017-10-19 ENCOUNTER — Encounter

## 2017-10-19 ENCOUNTER — Inpatient Hospital Stay: Admit: 2017-10-19 | Payer: MEDICAID

## 2017-10-19 DIAGNOSIS — R109 Unspecified abdominal pain: Secondary | ICD-10-CM

## 2017-10-19 MED ORDER — IOHEXOL 240 MG/ML IJ SOLN
240 MG/ML | Freq: Once | INTRAMUSCULAR | Status: AC | PRN
Start: 2017-10-19 — End: 2017-10-19
  Administered 2017-10-19: 21:00:00 50 mL via ORAL

## 2017-10-19 MED ORDER — IOPAMIDOL 76 % IV SOLN
76 % | Freq: Once | INTRAVENOUS | Status: AC | PRN
Start: 2017-10-19 — End: 2017-10-19
  Administered 2017-10-19: 21:00:00 75 mL via INTRAVENOUS

## 2018-01-10 ENCOUNTER — Inpatient Hospital Stay
Admit: 2018-01-10 | Discharge: 2018-01-11 | Discharge status: Against Medical Advice | Payer: MEDICAID | Attending: Emergency Medicine

## 2018-01-10 ENCOUNTER — Emergency Department: Admit: 2018-01-10 | Payer: MEDICAID

## 2018-01-10 DIAGNOSIS — R51 Headache: Secondary | ICD-10-CM

## 2018-01-10 LAB — BASIC METABOLIC PANEL W/ REFLEX TO MG FOR LOW K
Anion Gap: 11 (ref 3–16)
BUN: 5 mg/dL — ABNORMAL LOW (ref 7–20)
CO2: 21 mmol/L (ref 21–32)
Calcium: 9 mg/dL (ref 8.3–10.6)
Chloride: 106 mmol/L (ref 99–110)
Creatinine: 0.6 mg/dL (ref 0.6–1.1)
GFR African American: >60 (ref >60)
GFR Non-African American: >60 (ref >60)
Glucose: 103 mg/dL — ABNORMAL HIGH (ref 70–99)
Potassium reflex Magnesium: 4.6 mmol/L (ref 3.5–5.1)
Sodium: 138 mmol/L (ref 136–145)

## 2018-01-10 LAB — CBC WITH AUTO DIFFERENTIAL
Basophils %: 0.5 %
Basophils Absolute: 0 10*3/uL (ref 0.0–0.2)
Eosinophils %: 1.3 %
Eosinophils Absolute: 0.1 10*3/uL (ref 0.0–0.6)
Hematocrit: 42.6 % (ref 36.0–48.0)
Hemoglobin: 14.1 g/dL (ref 12.0–16.0)
Lymphocytes %: 28 %
Lymphocytes Absolute: 1.6 10*3/uL (ref 1.0–5.1)
MCH: 30.1 pg (ref 26.0–34.0)
MCHC: 33.2 g/dL (ref 31.0–36.0)
MCV: 90.7 fL (ref 80.0–100.0)
MPV: 10.3 fL (ref 5.0–10.5)
Monocytes %: 5.9 %
Monocytes Absolute: 0.3 10*3/uL (ref 0.0–1.3)
Neutrophils %: 64.3 %
Neutrophils Absolute: 3.7 10*3/uL (ref 1.7–7.7)
Platelets: 206 10*3/uL (ref 135–450)
RBC: 4.7 M/uL (ref 4.00–5.20)
RDW: 13.9 % (ref 12.4–15.4)
WBC: 5.8 10*3/uL (ref 4.0–11.0)

## 2018-01-10 MED ORDER — PROCHLORPERAZINE EDISYLATE 10 MG/2ML IJ SOLN
10 MG/2ML | Freq: Once | INTRAMUSCULAR | Status: AC
Start: 2018-01-10 — End: 2018-01-10
  Administered 2018-01-10: 17:00:00 10 mg via INTRAVENOUS

## 2018-01-10 MED ORDER — NAPROXEN 375 MG PO TABS
375 MG | ORAL_TABLET | Freq: Two times a day (BID) | ORAL | 0 refills | Status: AC
Start: 2018-01-10 — End: 2018-01-20

## 2018-01-10 MED ORDER — BUTALBITAL-APAP-CAFFEINE 50-325-40 MG PO TABS
50-325-40 MG | Freq: Once | ORAL | Status: AC
Start: 2018-01-10 — End: 2018-01-10
  Administered 2018-01-10: 17:00:00 1 via ORAL

## 2018-01-10 MED ORDER — SODIUM CHLORIDE 0.9 % IV BOLUS
0.9 % | Freq: Once | INTRAVENOUS | Status: AC
Start: 2018-01-10 — End: 2018-01-10
  Administered 2018-01-10: 17:00:00 1000 mL via INTRAVENOUS

## 2018-01-10 MED ORDER — ONDANSETRON 4 MG PO TBDP
4 MG | ORAL_TABLET | Freq: Three times a day (TID) | ORAL | 0 refills | Status: AC | PRN
Start: 2018-01-10 — End: ?

## 2018-01-10 MED ORDER — DIPHENHYDRAMINE HCL 50 MG/ML IJ SOLN
50 MG/ML | Freq: Once | INTRAMUSCULAR | Status: AC
Start: 2018-01-10 — End: 2018-01-10
  Administered 2018-01-10: 17:00:00 25 mg via INTRAVENOUS

## 2018-01-10 MED FILL — BUTALBITAL-APAP-CAFFEINE 50-325-40 MG PO TABS: 50-325-40 mg | ORAL | Qty: 1

## 2018-01-10 MED FILL — PROCHLORPERAZINE EDISYLATE 10 MG/2ML IJ SOLN: 10 MG/2ML | INTRAMUSCULAR | Qty: 2

## 2018-01-10 MED FILL — DIPHENHYDRAMINE HCL 50 MG/ML IJ SOLN: 50 mg/mL | INTRAMUSCULAR | Qty: 1

## 2018-01-10 NOTE — Discharge Instructions (Signed)
Please follow-up with your primary care physician in the next 2 days for reevaluation of your symptoms.  Return to the emergency department immediately should your headache change or worsen, you develop fevers, neck stiffness, uncontrolled vomiting, weakness or numbness.  Return if you develop any other new or changing symptoms which concern you and you wish to be reevaluated.

## 2018-01-10 NOTE — ED Notes (Signed)
Bed: 08  Expected date:   Expected time:   Means of arrival:   Comments:  squad     Venita LickLeslie A Aldrin Engelhard, RN  01/10/18 1052

## 2018-01-10 NOTE — ED Provider Notes (Signed)
[facility] EMERGENCY DEPARTMENT PROVIDER NOTE    Patient Identification  Pt Name: Allison Kelly  MRN: [id number]  Birthdate October 21, 1960  Date of evaluation: 01/10/2018  Provider: Valetta Fuller, DO  PCP: Jodell Cipro, MD    Chief Complaint  Headache (pt brought in by [facility] from home. Pt c/o HA with some intermitten N/V x3 days. )      HPI  (History provided by patient)  This is a 57 y.o. female with pertinent past medical history of migraine headaches who was brought in by family for "migraine" which began 3 days ago and has been gradually worsening.  Associated with nausea and vomiting.  Patient ports history of similar headaches.  States she takes no medications for her headaches.  They described as achy, bifrontal, nothing makes it better or worse.  She denies thunderclap onset or worst headache of her life.    Patient is Nepali speaking, history and exam obtained with aid of translator [phone].     ROS  Const:  No fevers, no chills, no generalized weakness  Skin:  No rash, no lesions  Eyes:  No visual changes, no blurry or double vision, no pain  ENT:  No sore throat, no difficulty swallowing, no ear pan, no sinus pain or congestion  Card:  No chest pain, no palpitations, no edema  Resp:  No shortness of breath, no cough, no wheezing  Abd:  No abdominal pain, +nausea, +vomiting, no diarrhea  Genitourinary:  No dysuria, no hematuria, no vaginal discharge, no vaginal bleeding  MSK:  No joint pain, no myalgia  Neuro:  No focal weakness, +headache, no paresthesia    All other systems reviewed and negative unless otherwise noted in HPI      I have reviewed the following nursing documentation:  Allergies: Patient has no known allergies.    Past medical history:   Past Medical History:   Diagnosis Date   ??? Migraines      Past surgical history: History reviewed. No pertinent surgical history.    Home medications:   Previous Medications    BUTALBITAL-ACETAMINOPHEN-CAFFEINE (FIORICET) 50-325-40  MG PER TABLET    Take 1 tablet by mouth every 4 hours as needed for Headaches    FAMOTIDINE (PEPCID) 20 MG TABLET    Take 1 tablet by mouth 2 times daily    HYDROXYZINE (ATARAX) 50 MG TABLET    Take 50 mg by mouth daily as needed for Itching    IBUPROFEN (ADVIL;MOTRIN) 600 MG TABLET    Take 1 tablet by mouth every 6 hours as needed for Pain    RANITIDINE (ZANTAC) 150 MG TABLET    Take 1 tablet by mouth 2 times daily    SERTRALINE (ZOLOFT) 50 MG TABLET    Take 50 mg by mouth daily       Social history:  reports that she has never smoked. She has never used smokeless tobacco. She reports that she does not drink alcohol or use drugs.    Family history:  History reviewed. No pertinent family history.      Exam  ED Triage Vitals [01/10/18 1055]   BP Temp Temp Source Pulse Resp SpO2 Height Weight   (!) 145/94 97.2 ??F (36.2 ??C) Oral 67 18 94 % 5\' 1"  (1.549 m) 155 lb (70.3 kg)     Nursing note and vitals reviewed.  Constitutional: Well developed, well nourished. Non-toxic in appearance. Keeps eyes closed throughout exam, opens when prompted  HENT:  Head: Normocephalic and atraumatic.      Ears: External ears normal.      Nose: Nose normal.     Mouth: Membrane mucosa moist and pink.  Eyes: Anicteric sclera. No discharge. PERRL  Neck: Supple. Trachea midline.   Cardiovascular: RRR; no murmurs, rubs, or gallops.  Pulmonary/Chest: Effort normal. No respiratory distress. CTAB. No stridor. No wheezes. No rales.   Abdominal: Soft. No distension. Nontender.   Musculoskeletal: Moves all extremities. No gross deformity.  Neurological: Alert and orientedx4. Face symmetric. Speech is clear. CN 2-12 intact. 5/5 motor and sensation grossly intact all extremities. No pronator drift. Normal finger to nose, normal heel to shin. Downward Babinskis.   Skin: Warm and dry. No rash.  Psychiatric: Normal mood and affect. Behavior is normal.    Procedures        Radiology  CT Head WO Contrast   Final Result   No acute intracranial abnormality.              Labs  Results for orders placed or performed during the hospital encounter of 01/10/18   CBC Auto Differential   Result Value Ref Range    WBC 5.8 4.0 - 11.0 K/uL    RBC 4.70 4.00 - 5.20 M/uL    Hemoglobin 14.1 12.0 - 16.0 g/dL    Hematocrit 16.1 09.6 - 48.0 %    MCV 90.7 80.0 - 100.0 fL    MCH 30.1 26.0 - 34.0 pg    MCHC 33.2 31.0 - 36.0 g/dL    RDW 04.5 40.9 - 81.1 %    Platelets 206 135 - 450 K/uL    MPV 10.3 5.0 - 10.5 fL    Neutrophils % 64.3 %    Lymphocytes % 28.0 %    Monocytes % 5.9 %    Eosinophils % 1.3 %    Basophils % 0.5 %    Neutrophils Absolute 3.7 1.7 - 7.7 K/uL    Lymphocytes Absolute 1.6 1.0 - 5.1 K/uL    Monocytes Absolute 0.3 0.0 - 1.3 K/uL    Eosinophils Absolute 0.1 0.0 - 0.6 K/uL    Basophils Absolute 0.0 0.0 - 0.2 K/uL   Basic Metabolic Panel w/ Reflex to MG   Result Value Ref Range    Sodium 138 136 - 145 mmol/L    Potassium reflex Magnesium 4.6 3.5 - 5.1 mmol/L    Chloride 106 99 - 110 mmol/L    CO2 21 21 - 32 mmol/L    Anion Gap 11 3 - 16    Glucose 103 (H) 70 - 99 mg/dL    BUN 5 (L) 7 - 20 mg/dL    CREATININE 0.6 0.6 - 1.1 mg/dL    GFR Non-African American >60 >60    GFR African American >60 >60    Calcium 9.0 8.3 - 10.6 mg/dL       Screenings           MDM and ED Course    Patient afebrile and nontoxic.  Neuro exam is benign, nothing to suggest CVA/TIA.  No red flags by exam or history for SAH/ICH and suspicion is very low.  Nothing to suggest CNS infection. No indication for lumbar puncture. Laboratory, unremarkable, no clinically significant electrolyte abnormalities.  Patient received Esgic, Compazine, Benadryl and fluids.  On my reevaluation patient was sleeping in the room, she was easily arousable by voice and reported improvement in her headache.  Felt safe for discharge to home with close PCP follow-up.  Return precautions including worsening changing headache, neck stiffness, fevers, uncontrolled vomiting, weakness or numbness were discussed.        Final  Impression  1. Acute nonintractable headache, unspecified headache type        Blood pressure (!) 145/94, pulse 67, temperature 97.2 ??F (36.2 ??C), temperature source Oral, resp. rate 18, height 5\' 1"  (1.549 m), weight 155 lb (70.3 kg), SpO2 94 %.     Disposition:  DISPOSITION Decision To Discharge 01/10/2018 02:17:45 PM      Patient Referrals:  Jodell Ciprourtis William Taylor, MD  [address]  [place] [id number]  [phone]    In 2 days  For your general medical care and reevaluation of your headache    Salvadore FarberVijay Rajan, MD  [address], [address]  [place] [id number]  [phone]    In 1 week  For evaluation of your headaches and possible migraine      Discharge Medications:  New Prescriptions    NAPROXEN (NAPROSYN) 375 MG TABLET    Take 1 tablet by mouth 2 times daily (with meals) for 10 days    ONDANSETRON (ZOFRAN-ODT) 4 MG DISINTEGRATING TABLET    Take 1 tablet by mouth 3 times daily as needed for Nausea or Vomiting       Discontinued Medications:  Discontinued Medications    NAPROXEN (NAPROSYN) 500 MG TABLET    Take 500 mg by mouth 2 times daily (with meals)       This chart was generated using the Dragon dictation system. I created this record but it may contain dictation errors given the limitations of this technology.    Valetta FullerJoshua D Gay Rape, DO (electronically signed)  Attending Emergency Physician       Valetta FullerJoshua D Joane Postel, DO  01/10/18 1428

## 2018-01-10 NOTE — ED Notes (Signed)
[address] [phone]  Pt states she started with HA 3 days ago, then started with emesis. Pt attempted to take medication for HA but couldn't hold down mediation.   Pt is a/o x4 pt and family updated on plan of care.   Pt is on a continuous pulse oximetry and continued on cycling blood pressure. Fall risk precautions in place, call light in reach, bed side table within reach, bed alarm on, will continue to monitor.         Montel CulverElizabeth Ann Marita Burnsed, RN  01/10/18 1201

## 2018-04-09 ENCOUNTER — Encounter

## 2018-04-09 ENCOUNTER — Inpatient Hospital Stay: Admit: 2018-04-09 | Payer: MEDICAID | Primary: Internal Medicine

## 2018-04-09 ENCOUNTER — Inpatient Hospital Stay: Payer: MEDICAID | Primary: Internal Medicine

## 2018-04-09 DIAGNOSIS — R52 Pain, unspecified: Secondary | ICD-10-CM
